# Patient Record
Sex: Female | Born: 1990
Health system: Southern US, Community
[De-identification: ages and names within clinical notes are randomized; demographics above are authoritative.]

## PROBLEM LIST (undated history)

## (undated) DIAGNOSIS — O09299 Supervision of pregnancy with other poor reproductive or obstetric history, unspecified trimester: Secondary | ICD-10-CM

## (undated) DIAGNOSIS — Z8489 Family history of other specified conditions: Secondary | ICD-10-CM

## (undated) DIAGNOSIS — D649 Anemia, unspecified: Secondary | ICD-10-CM

## (undated) DIAGNOSIS — O139 Gestational [pregnancy-induced] hypertension without significant proteinuria, unspecified trimester: Secondary | ICD-10-CM

## (undated) HISTORY — PX: NO PAST SURGERIES: SHX2092

## (undated) HISTORY — DX: Gestational (pregnancy-induced) hypertension without significant proteinuria, unspecified trimester: O13.9

## (undated) HISTORY — DX: Supervision of pregnancy with other poor reproductive or obstetric history, unspecified trimester: O09.299

---
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
## 2014-04-17 ENCOUNTER — Other Ambulatory Visit: Payer: Self-pay | Admitting: Occupational Medicine

## 2014-04-17 ENCOUNTER — Ambulatory Visit: Payer: Self-pay

## 2014-04-17 DIAGNOSIS — R7612 Nonspecific reaction to cell mediated immunity measurement of gamma interferon antigen response without active tuberculosis: Secondary | ICD-10-CM

## 2014-08-19 ENCOUNTER — Encounter (HOSPITAL_COMMUNITY): Payer: Self-pay | Admitting: Emergency Medicine

## 2014-08-19 ENCOUNTER — Emergency Department (HOSPITAL_COMMUNITY)
Admission: EM | Admit: 2014-08-19 | Discharge: 2014-08-20 | Disposition: A | Discharge status: Home / Self Care | Payer: 59 | Attending: Emergency Medicine | Admitting: Emergency Medicine

## 2014-08-19 DIAGNOSIS — R1030 Lower abdominal pain, unspecified: Secondary | ICD-10-CM | POA: Diagnosis present

## 2014-08-19 DIAGNOSIS — Z793 Long term (current) use of hormonal contraceptives: Secondary | ICD-10-CM | POA: Insufficient documentation

## 2014-08-19 DIAGNOSIS — Z3202 Encounter for pregnancy test, result negative: Secondary | ICD-10-CM | POA: Insufficient documentation

## 2014-08-19 DIAGNOSIS — R110 Nausea: Secondary | ICD-10-CM | POA: Diagnosis not present

## 2014-08-19 DIAGNOSIS — R102 Pelvic and perineal pain: Secondary | ICD-10-CM | POA: Diagnosis not present

## 2014-08-19 LAB — CBC WITH DIFFERENTIAL/PLATELET
BASOS PCT: 1 % (ref 0–1)
Basophils Absolute: 0.1 10*3/uL (ref 0.0–0.1)
EOS ABS: 0.2 10*3/uL (ref 0.0–0.7)
Eosinophils Relative: 1 % (ref 0–5)
HEMATOCRIT: 34.6 % — AB (ref 36.0–46.0)
Hemoglobin: 11.6 g/dL — ABNORMAL LOW (ref 12.0–15.0)
Lymphocytes Relative: 22 % (ref 12–46)
Lymphs Abs: 2.8 10*3/uL (ref 0.7–4.0)
MCH: 28.2 pg (ref 26.0–34.0)
MCHC: 33.5 g/dL (ref 30.0–36.0)
MCV: 84.2 fL (ref 78.0–100.0)
MONO ABS: 0.8 10*3/uL (ref 0.1–1.0)
Monocytes Relative: 7 % (ref 3–12)
NEUTROS PCT: 69 % (ref 43–77)
Neutro Abs: 8.5 10*3/uL — ABNORMAL HIGH (ref 1.7–7.7)
Platelets: 291 10*3/uL (ref 150–400)
RBC: 4.11 MIL/uL (ref 3.87–5.11)
RDW: 13 % (ref 11.5–15.5)
WBC: 12.3 10*3/uL — ABNORMAL HIGH (ref 4.0–10.5)

## 2014-08-19 LAB — I-STAT CHEM 8, ED
BUN: 13 mg/dL (ref 6–23)
CALCIUM ION: 1.17 mmol/L (ref 1.12–1.23)
CREATININE: 0.8 mg/dL (ref 0.50–1.10)
Chloride: 103 mEq/L (ref 96–112)
GLUCOSE: 132 mg/dL — AB (ref 70–99)
HEMATOCRIT: 38 % (ref 36.0–46.0)
HEMOGLOBIN: 12.9 g/dL (ref 12.0–15.0)
Potassium: 3.8 mEq/L (ref 3.7–5.3)
Sodium: 138 mEq/L (ref 137–147)
TCO2: 23 mmol/L (ref 0–100)

## 2014-08-19 LAB — URINALYSIS, ROUTINE W REFLEX MICROSCOPIC
BILIRUBIN URINE: NEGATIVE
Glucose, UA: NEGATIVE mg/dL
KETONES UR: NEGATIVE mg/dL
Leukocytes, UA: NEGATIVE
Nitrite: NEGATIVE
PH: 6 (ref 5.0–8.0)
PROTEIN: NEGATIVE mg/dL
Specific Gravity, Urine: 1.025 (ref 1.005–1.030)
Urobilinogen, UA: 0.2 mg/dL (ref 0.0–1.0)

## 2014-08-19 LAB — POC URINE PREG, ED: Preg Test, Ur: NEGATIVE

## 2014-08-19 LAB — URINE MICROSCOPIC-ADD ON

## 2014-08-19 NOTE — ED Notes (Addendum)
Pt c/o lower abd pain onset approx 20 min. Ago.  Nausea with no vomiting.   Pt st's she was having sex when pain started.

## 2014-08-20 LAB — RPR

## 2014-08-20 LAB — WET PREP, GENITAL
Clue Cells Wet Prep HPF POC: NONE SEEN
TRICH WET PREP: NONE SEEN
WBC, Wet Prep HPF POC: NONE SEEN
Yeast Wet Prep HPF POC: NONE SEEN

## 2014-08-20 LAB — HIV ANTIBODY (ROUTINE TESTING W REFLEX): HIV: NONREACTIVE

## 2014-08-20 NOTE — ED Provider Notes (Signed)
CSN: [id number]     Arrival date & time 08/19/14  2117 History   First MD Initiated Contact with Patient 08/19/14 2346     Chief Complaint  Patient presents with  . Abdominal Pain     (Consider location/radiation/quality/duration/timing/severity/associated sxs/prior Treatment) HPI  This a 23 year old female who presents with abdominal pain. Patient reports that she was having sex just prior to arrival when she had onset of crampy lower abdominal pain. She has had one similar episode in the past. She states that it becomes difficult to move or walk secondary to the pain. Her pain is somewhat subsided at this time and is 2 out of 10. When she was experiencing pain she had nausea. She reports that she has one sexual partner denies any vaginal discharge or dysuria. Denies any vomiting or diarrhea.  History reviewed. No pertinent past medical history. History reviewed. No pertinent past surgical history. No family history on file. History  Substance Use Topics  . Smoking status: Never Smoker   . Smokeless tobacco: Not on file  . Alcohol Use: Not on file     Comment: rare   OB History    No data available     Review of Systems  Constitutional: Negative for fever.  Respiratory: Negative for cough, chest tightness and shortness of breath.   Cardiovascular: Negative for chest pain.  Gastrointestinal: Positive for nausea and abdominal pain. Negative for vomiting, diarrhea, constipation and blood in stool.  Genitourinary: Negative for dysuria, vaginal bleeding and vaginal discharge.  Musculoskeletal: Negative for back pain.  Neurological: Negative for headaches.  All other systems reviewed and are negative.     Allergies  Mango flavor  Home Medications   Prior to Admission medications   Medication Sig Start Date End Date Taking? Authorizing Provider  ibuprofen (ADVIL,MOTRIN) 200 MG tablet Take 200 mg by mouth every 6 (six) hours as needed for cramping.   Yes Historical Provider,  MD  Norgestimate-Ethinyl Estradiol Triphasic (TRI-SPRINTEC) 0.18/0.215/0.25 MG-35 MCG tablet Take 1 tablet by mouth daily.   Yes Historical Provider, MD   BP 106/64 mmHg  Pulse 65  Temp(Src) 97.9 F (36.6 C)  Resp 16  SpO2 100%  LMP 08/05/2014 Physical Exam  Constitutional: She is oriented to person, place, and time. She appears well-developed and well-nourished. No distress.  HENT:  Head: Normocephalic and atraumatic.  Cardiovascular: Normal rate, regular rhythm and normal heart sounds.   No murmur heard. Pulmonary/Chest: Effort normal and breath sounds normal. No respiratory distress. She has no wheezes.  Abdominal: Soft. Bowel sounds are normal. There is no tenderness. There is no rebound and no guarding.  Genitourinary: Vagina normal and uterus normal.  No adnexal tenderness or cervical motion tenderness noted, scant vaginal discharge  Neurological: She is alert and oriented to person, place, and time.  Skin: Skin is warm and dry.  Psychiatric: She has a normal mood and affect.  Nursing note and vitals reviewed.   ED Course  Procedures (including critical care time) Labs Review Labs Reviewed  URINALYSIS, ROUTINE W REFLEX MICROSCOPIC - Abnormal; Notable for the following:    Hgb urine dipstick SMALL (*)    All other components within normal limits  CBC WITH DIFFERENTIAL - Abnormal; Notable for the following:    WBC 12.3 (*)    Hemoglobin 11.6 (*)    HCT 34.6 (*)    Neutro Abs 8.5 (*)    All other components within normal limits  URINE MICROSCOPIC-ADD ON - Abnormal; Notable for the following:  Squamous Epithelial / LPF FEW (*)    All other components within normal limits  I-STAT CHEM 8, ED - Abnormal; Notable for the following:    Glucose, Bld 132 (*)    All other components within normal limits  WET PREP, GENITAL  GC/CHLAMYDIA PROBE AMP  PREGNANCY, URINE  RPR  HIV ANTIBODY (ROUTINE TESTING)  POC URINE PREG, ED    Imaging Review No results found.   EKG  Interpretation None      MDM   Final diagnoses:  Pelvic pain in female    Patient presents with pelvic pain in the setting of sexual activity. Currently is comfortable and pain is only 2 out of 10. She has no lateralizing findings on exam and pelvic exam is largely unremarkable. Basic labwork is also unrevealing.  Patient reports that she is not concerned about STDs. Screening was sent but patient was not treated; while PID is a consideration, patient does not have any physical exam findings consistent with this. Other saturations include endometriosis.  Will have patient follow-up with OB/GYN. Patient given strict return precautions.  After history, exam, and medical workup I feel the patient has been appropriately medically screened and is safe for discharge home. Pertinent diagnoses were discussed with the patient. Patient was given return precautions.     Shon Batonourtney F Lillion Elbert, MD 08/20/14 [id number]

## 2014-08-20 NOTE — Discharge Instructions (Signed)
You were evaluated for pelvic pain. Ear exam and workup does not reveal a cause. You need to follow-up with an OB/GYN.  Abdominal Pain, Women Abdominal (stomach, pelvic, or belly) pain can be caused by many things. It is important to tell your doctor:  The location of the pain.  Does it come and go or is it present all the time?  Are there things that start the pain (eating certain foods, exercise)?  Are there other symptoms associated with the pain (fever, nausea, vomiting, diarrhea)? All of this is helpful to know when trying to find the cause of the pain. CAUSES   Stomach: virus or bacteria infection, or ulcer.  Intestine: appendicitis (inflamed appendix), regional ileitis (Crohn's disease), ulcerative colitis (inflamed colon), irritable bowel syndrome, diverticulitis (inflamed diverticulum of the colon), or cancer of the stomach or intestine.  Gallbladder disease or stones in the gallbladder.  Kidney disease, kidney stones, or infection.  Pancreas infection or cancer.  Fibromyalgia (pain disorder).  Diseases of the female organs:  Uterus: fibroid (non-cancerous) tumors or infection.  Fallopian tubes: infection or tubal pregnancy.  Ovary: cysts or tumors.  Pelvic adhesions (scar tissue).  Endometriosis (uterus lining tissue growing in the pelvis and on the pelvic organs).  Pelvic congestion syndrome (female organs filling up with blood just before the menstrual period).  Pain with the menstrual period.  Pain with ovulation (producing an egg).  Pain with an IUD (intrauterine device, birth control) in the uterus.  Cancer of the female organs.  Functional pain (pain not caused by a disease, may improve without treatment).  Psychological pain.  Depression. DIAGNOSIS  Your doctor will decide the seriousness of your pain by doing an examination.  Blood tests.  X-rays.  Ultrasound.  CT scan (computed tomography, special type of X-ray).  MRI (magnetic  resonance imaging).  Cultures, for infection.  Barium enema (dye inserted in the large intestine, to better view it with X-rays).  Colonoscopy (looking in intestine with a lighted tube).  Laparoscopy (minor surgery, looking in abdomen with a lighted tube).  Major abdominal exploratory surgery (looking in abdomen with a large incision). TREATMENT  The treatment will depend on the cause of the pain.   Many cases can be observed and treated at home.  Over-the-counter medicines recommended by your caregiver.  Prescription medicine.  Antibiotics, for infection.  Birth control pills, for painful periods or for ovulation pain.  Hormone treatment, for endometriosis.  Nerve blocking injections.  Physical therapy.  Antidepressants.  Counseling with a psychologist or psychiatrist.  Minor or major surgery. HOME CARE INSTRUCTIONS   Do not take laxatives, unless directed by your caregiver.  Take over-the-counter pain medicine only if ordered by your caregiver. Do not take aspirin because it can cause an upset stomach or bleeding.  Try a clear liquid diet (broth or water) as ordered by your caregiver. Slowly move to a bland diet, as tolerated, if the pain is related to the stomach or intestine.  Have a thermometer and take your temperature several times a day, and record it.  Bed rest and sleep, if it helps the pain.  Avoid sexual intercourse, if it causes pain.  Avoid stressful situations.  Keep your follow-up appointments and tests, as your caregiver orders.  If the pain does not go away with medicine or surgery, you may try:  Acupuncture.  Relaxation exercises (yoga, meditation).  Group therapy.  Counseling. SEEK MEDICAL CARE IF:   You notice certain foods cause stomach pain.  Your home care  treatment is not helping your pain.  You need stronger pain medicine.  You want your IUD removed.  You feel faint or lightheaded.  You develop nausea and  vomiting.  You develop a rash.  You are having side effects or an allergy to your medicine. SEEK IMMEDIATE MEDICAL CARE IF:   Your pain does not go away or gets worse.  You have a fever.  Your pain is felt only in portions of the abdomen. The right side could possibly be appendicitis. The left lower portion of the abdomen could be colitis or diverticulitis.  You are passing blood in your stools (bright red or black tarry stools, with or without vomiting).  You have blood in your urine.  You develop chills, with or without a fever.  You pass out. MAKE SURE YOU:   Understand these instructions.  Will watch your condition.  Will get help right away if you are not doing well or get worse. Document Released: 07/17/2007 Document Revised: 02/03/2014 Document Reviewed: 08/06/2009 [facility] Patient Information 2015 [place], [place]. This information is not intended to replace advice given to you by your health care provider. Make sure you discuss any questions you have with your health care provider.

## 2014-08-21 LAB — GC/CHLAMYDIA PROBE AMP
CT Probe RNA: NEGATIVE
GC Probe RNA: NEGATIVE

## 2014-10-03 NOTE — L&D Delivery Note (Signed)
SVD of VMI at 0509 on 10/03/15.  APGARs 6,8.  Delivery attended by NICU for moderate meconium stained fluid, magnesium sulfate >24 hours.  Placenta to pathology. The patient was pushing well but has a narrow pelvis.  FHT were in the 90s and the patient was counseled for vacuum assistance including risk of cephalohematoma and brain bleed.  A single push/pull effort was performed and there was a pop off.  After this application, FHT improved to 110s and the patient began pushing more effectively.  The baby delivered in ROA position.  Anterior shoulder did not easily deliver but the posterior arm did and the body delivered thereafter.  Cord was clamped, cut and taken to the warmer where NICU was waiting.  Cord gas and blood was obtained.  Placenta delivered S/I/3VC.  Fundus was firmed with pitocin and massage.  Perineum intact.  Mom and baby stable.  Will continue magnesium sulfate x 24 hours postpartum.    Mitchel HonourMegan Journee Kohen, DO

## 2015-03-19 LAB — OB RESULTS CONSOLE ABO/RH: RH Type: POSITIVE

## 2015-03-19 LAB — OB RESULTS CONSOLE RUBELLA ANTIBODY, IGM: RUBELLA: IMMUNE

## 2015-03-19 LAB — OB RESULTS CONSOLE GC/CHLAMYDIA
Chlamydia: NEGATIVE
Gonorrhea: NEGATIVE

## 2015-03-19 LAB — OB RESULTS CONSOLE RPR: RPR: NONREACTIVE

## 2015-03-19 LAB — OB RESULTS CONSOLE HIV ANTIBODY (ROUTINE TESTING): HIV: NONREACTIVE

## 2015-03-19 LAB — OB RESULTS CONSOLE ANTIBODY SCREEN: Antibody Screen: NEGATIVE

## 2015-03-19 LAB — OB RESULTS CONSOLE HEPATITIS B SURFACE ANTIGEN: HEP B S AG: NEGATIVE

## 2015-03-23 ENCOUNTER — Inpatient Hospital Stay (HOSPITAL_COMMUNITY): Admission: AD | Admit: 2015-03-23 | Payer: 59 | Source: Ambulatory Visit | Admitting: Obstetrics and Gynecology

## 2015-09-24 LAB — OB RESULTS CONSOLE GBS: STREP GROUP B AG: POSITIVE

## 2015-10-01 ENCOUNTER — Inpatient Hospital Stay (HOSPITAL_COMMUNITY): Payer: 59

## 2015-10-01 ENCOUNTER — Encounter (HOSPITAL_COMMUNITY): Payer: Self-pay | Admitting: *Deleted

## 2015-10-01 ENCOUNTER — Inpatient Hospital Stay (HOSPITAL_COMMUNITY)
Admission: AD | Admit: 2015-10-01 | Discharge: 2015-10-07 | DRG: 775 | Disposition: A | Discharge status: Home / Self Care | Payer: 59 | Source: Ambulatory Visit | Attending: Obstetrics & Gynecology | Admitting: Obstetrics & Gynecology

## 2015-10-01 DIAGNOSIS — O99824 Streptococcus B carrier state complicating childbirth: Secondary | ICD-10-CM | POA: Diagnosis present

## 2015-10-01 DIAGNOSIS — Z3A36 36 weeks gestation of pregnancy: Secondary | ICD-10-CM

## 2015-10-01 DIAGNOSIS — O133 Gestational [pregnancy-induced] hypertension without significant proteinuria, third trimester: Secondary | ICD-10-CM | POA: Diagnosis not present

## 2015-10-01 DIAGNOSIS — Z91018 Allergy to other foods: Secondary | ICD-10-CM

## 2015-10-01 DIAGNOSIS — O163 Unspecified maternal hypertension, third trimester: Secondary | ICD-10-CM

## 2015-10-01 DIAGNOSIS — O1490 Unspecified pre-eclampsia, unspecified trimester: Secondary | ICD-10-CM | POA: Diagnosis present

## 2015-10-01 DIAGNOSIS — O1493 Unspecified pre-eclampsia, third trimester: Secondary | ICD-10-CM | POA: Diagnosis present

## 2015-10-01 DIAGNOSIS — O1414 Severe pre-eclampsia complicating childbirth: Principal | ICD-10-CM | POA: Diagnosis present

## 2015-10-01 DIAGNOSIS — O1494 Unspecified pre-eclampsia, complicating childbirth: Secondary | ICD-10-CM | POA: Diagnosis present

## 2015-10-01 HISTORY — DX: Anemia, unspecified: D64.9

## 2015-10-01 LAB — URINE MICROSCOPIC-ADD ON: Bacteria, UA: NONE SEEN

## 2015-10-01 LAB — URINALYSIS, ROUTINE W REFLEX MICROSCOPIC
Bilirubin Urine: NEGATIVE
GLUCOSE, UA: 100 mg/dL — AB
Ketones, ur: NEGATIVE mg/dL
LEUKOCYTES UA: NEGATIVE
Nitrite: NEGATIVE
Protein, ur: >300 mg/dL — AB
Specific Gravity, Urine: 1.025 (ref 1.005–1.030)
pH: 6 (ref 5.0–8.0)

## 2015-10-01 LAB — COMPREHENSIVE METABOLIC PANEL
ALT: 29 U/L (ref 14–54)
ANION GAP: 7 (ref 5–15)
AST: 34 U/L (ref 15–41)
Albumin: 2.4 g/dL — ABNORMAL LOW (ref 3.5–5.0)
Alkaline Phosphatase: 142 U/L — ABNORMAL HIGH (ref 38–126)
BILIRUBIN TOTAL: 0.5 mg/dL (ref 0.3–1.2)
BUN: 10 mg/dL (ref 6–20)
CALCIUM: 8.6 mg/dL — AB (ref 8.9–10.3)
CO2: 21 mmol/L — ABNORMAL LOW (ref 22–32)
Chloride: 109 mmol/L (ref 101–111)
Creatinine, Ser: 0.82 mg/dL (ref 0.44–1.00)
GFR calc non Af Amer: >60 mL/min (ref >60)
Glucose, Bld: 106 mg/dL — ABNORMAL HIGH (ref 65–99)
Potassium: 4.1 mmol/L (ref 3.5–5.1)
Sodium: 137 mmol/L (ref 135–145)
Total Protein: 5.7 g/dL — ABNORMAL LOW (ref 6.5–8.1)

## 2015-10-01 LAB — TYPE AND SCREEN
ABO/RH(D): A POS
Antibody Screen: NEGATIVE

## 2015-10-01 LAB — CBC WITH DIFFERENTIAL/PLATELET
Basophils Absolute: 0.1 10*3/uL (ref 0.0–0.1)
Basophils Relative: 1 %
Eosinophils Absolute: 0.1 10*3/uL (ref 0.0–0.7)
Eosinophils Relative: 1 %
HEMATOCRIT: 33 % — AB (ref 36.0–46.0)
Hemoglobin: 10.9 g/dL — ABNORMAL LOW (ref 12.0–15.0)
LYMPHS PCT: 19 %
Lymphs Abs: 1.8 10*3/uL (ref 0.7–4.0)
MCH: 26.5 pg (ref 26.0–34.0)
MCHC: 33 g/dL (ref 30.0–36.0)
MCV: 80.3 fL (ref 78.0–100.0)
Monocytes Absolute: 0.4 10*3/uL (ref 0.1–1.0)
Monocytes Relative: 4 %
NEUTROS ABS: 7.4 10*3/uL (ref 1.7–7.7)
Neutrophils Relative %: 75 %
Platelets: 293 10*3/uL (ref 150–400)
RBC: 4.11 MIL/uL (ref 3.87–5.11)
RDW: 15.9 % — AB (ref 11.5–15.5)
WBC: 9.7 10*3/uL (ref 4.0–10.5)

## 2015-10-01 LAB — PROTEIN / CREATININE RATIO, URINE
Creatinine, Urine: 249 mg/dL
PROTEIN CREATININE RATIO: 11.52 mg/mg{creat} — AB (ref 0.00–0.15)
TOTAL PROTEIN, URINE: 2868 mg/dL

## 2015-10-01 LAB — URIC ACID: URIC ACID, SERUM: 5.6 mg/dL (ref 2.3–6.6)

## 2015-10-01 LAB — LACTATE DEHYDROGENASE: LDH: 166 U/L (ref 98–192)

## 2015-10-01 MED ORDER — BETAMETHASONE SOD PHOS & ACET 6 (3-3) MG/ML IJ SUSP
12.0000 mg | INTRAMUSCULAR | Status: AC
  Administered 2015-10-01 – 2015-10-02 (×2): 12 mg via INTRAMUSCULAR
  Filled 2015-10-01 (×2): qty 2

## 2015-10-01 MED ORDER — ZOLPIDEM TARTRATE 5 MG PO TABS: 5.0000 mg | ORAL_TABLET | Freq: Every evening | ORAL | Status: DC | PRN

## 2015-10-01 MED ORDER — MISOPROSTOL 25 MCG QUARTER TABLET
25.0000 ug | ORAL_TABLET | ORAL | Status: DC | PRN
  Administered 2015-10-01 – 2015-10-02 (×4): 25 ug via VAGINAL
  Filled 2015-10-01 (×4): qty 0.25
  Filled 2015-10-01: qty 1
  Filled 2015-10-01: qty 0.25

## 2015-10-01 MED ORDER — MAGNESIUM SULFATE 50 % IJ SOLN
1.0000 g/h | INTRAVENOUS | Status: DC
  Administered 2015-10-02: 2 g/h via INTRAVENOUS
  Filled 2015-10-01 (×2): qty 80

## 2015-10-01 MED ORDER — LACTATED RINGERS IV SOLN
INTRAVENOUS | Status: DC
  Administered 2015-10-01: 21:00:00 via INTRAVENOUS
  Administered 2015-10-02: 100 mL/h via INTRAVENOUS
  Administered 2015-10-03: via INTRAVENOUS

## 2015-10-01 MED ORDER — LABETALOL HCL 5 MG/ML IV SOLN
20.0000 mg | INTRAVENOUS | Status: AC | PRN
  Administered 2015-10-01: 40 mg via INTRAVENOUS
  Administered 2015-10-01: 20 mg via INTRAVENOUS
  Administered 2015-10-01: 80 mg via INTRAVENOUS
  Filled 2015-10-01: qty 4
  Filled 2015-10-01: qty 16
  Filled 2015-10-01: qty 8

## 2015-10-01 MED ORDER — HYDRALAZINE HCL 20 MG/ML IJ SOLN: 10.0000 mg | Freq: Once | INTRAMUSCULAR | Status: DC | PRN

## 2015-10-01 MED ORDER — LABETALOL HCL 100 MG PO TABS
200.0000 mg | ORAL_TABLET | Freq: Two times a day (BID) | ORAL | Status: DC
  Administered 2015-10-01 – 2015-10-02 (×3): 200 mg via ORAL
  Filled 2015-10-01 (×2): qty 2

## 2015-10-01 MED ORDER — CALCIUM CARBONATE ANTACID 500 MG PO CHEW: 2.0000 | CHEWABLE_TABLET | ORAL | Status: DC | PRN

## 2015-10-01 MED ORDER — DOCUSATE SODIUM 100 MG PO CAPS
100.0000 mg | ORAL_CAPSULE | Freq: Every day | ORAL | Status: DC
  Administered 2015-10-02: 100 mg via ORAL
  Filled 2015-10-01: qty 1

## 2015-10-01 MED ORDER — TERBUTALINE SULFATE 1 MG/ML IJ SOLN: 0.2500 mg | Freq: Once | INTRAMUSCULAR | Status: DC | PRN

## 2015-10-01 MED ORDER — PENICILLIN G POTASSIUM 5000000 UNITS IJ SOLR
2.5000 10*6.[IU] | INTRAVENOUS | Status: DC
  Administered 2015-10-02 – 2015-10-03 (×5): 2.5 10*6.[IU] via INTRAVENOUS
  Filled 2015-10-01 (×8): qty 2.5

## 2015-10-01 MED ORDER — MAGNESIUM SULFATE BOLUS VIA INFUSION
4.0000 g | Freq: Once | INTRAVENOUS | Status: AC
  Administered 2015-10-01: 4 g via INTRAVENOUS
  Filled 2015-10-01: qty 500

## 2015-10-01 MED ORDER — ACETAMINOPHEN 325 MG PO TABS: 650.0000 mg | ORAL_TABLET | ORAL | Status: DC | PRN

## 2015-10-01 MED ORDER — PENICILLIN G POTASSIUM 5000000 UNITS IJ SOLR
5.0000 10*6.[IU] | Freq: Once | INTRAVENOUS | Status: AC
  Administered 2015-10-02: 5 10*6.[IU] via INTRAVENOUS
  Filled 2015-10-01: qty 5

## 2015-10-01 MED ORDER — PRENATAL MULTIVITAMIN CH
1.0000 | ORAL_TABLET | Freq: Every day | ORAL | Status: DC
  Administered 2015-10-02: 1 via ORAL
  Filled 2015-10-01: qty 1

## 2015-10-01 NOTE — MAU Note (Signed)
OBs office sent pt for PIH eval;

## 2015-10-01 NOTE — H&P (Signed)
Rachel Blevins is a 24 y.o. female presenting at 4036.2 with elevated blood pressure and protienuria in the office.  In MAU  Initial bp 149/98. But later it was 178/109 and 181/114.  Protein in the urine 2868 gm.  pih labs otherwise were normal. admitt for steriod and then induction. Maternal Medical History:  Fetal activity: Perceived fetal activity is normal.    Prenatal complications: Pre-eclampsia.   Prenatal Complications - Diabetes: none.    OB History    Gravida Para Term Preterm AB TAB SAB Ectopic Multiple Living   1              Past Medical History  Diagnosis Date  . Anemia    Past Surgical History  Procedure Laterality Date  . No past surgeries     Family History: family history includes Hypertension in her father, maternal grandmother, and mother. Social History:  reports that she has never smoked. She does not have any smokeless tobacco history on file. She reports that she does not drink alcohol or use illicit drugs.   Prenatal Transfer Tool  Maternal Diabetes: No Genetic Screening: Normal Maternal Ultrasounds/Referrals: Normal Fetal Ultrasounds or other Referrals:  None Maternal Substance Abuse:  No Significant Maternal Medications:  None Significant Maternal Lab Results:  Lab values include: Group B Strep positive Other Comments:  None  ROS    Blood pressure 178/109, pulse 75. Maternal Exam:  Uterine Assessment: Contraction strength is mild.  Contraction frequency is irregular.   Abdomen: Patient reports no abdominal tenderness. Fundal height is c/w dates.   Fetal presentation: vertex  Pelvis: adequate for delivery.      Fetal Exam Fetal State Assessment: Category I - tracings are normal.     Physical Exam  Constitutional: She is oriented to person, place, and time.  Cardiovascular: Normal rate, regular rhythm and normal heart sounds.   Respiratory: Effort normal and breath sounds normal.  GI:  Gravid uterus c/w dates  Musculoskeletal: She  exhibits edema.  Neurological: She is alert and oriented to person, place, and time. She has normal reflexes.    Prenatal labs: ABO, Rh:   Antibody:   Rubella:   RPR:    HBsAg:    HIV:    GBS:     Assessment/Plan: IUP at 36.2 with pre-eclampsia BPP pending. Will admitt  Give steriods per recent acog guidelines and the induce.   Nina Hoar S 10/01/2015, 6:19 PM

## 2015-10-01 NOTE — MAU Provider Note (Signed)
History     CSN: [id number]  Arrival date and time: 10/01/15 1341   First Provider Initiated Contact with Patient 10/01/15 1502      Chief Complaint  Patient presents with  . Hypertension   HPI Rachel Blevins 24 y.o. G1P0  presents to MAU for PIH evaluation.  Earlier today, she was seen in office and found to have elevated bp and protein in urine.  No other complaints today.  She states a week ago she noticed a dramatic increase in swelling in her face, feet and hands.  She also started having headaches at that time.  They are mild and she does not take anything for them.  She also has recently started seeing spots - particularly with showering.  She gets very tired with walking.  She denies epigastric pain, LOF, vaginal bleeding, ctx, dysuria.  She notes good fetal movement.  OB History    Gravida Para Term Preterm AB TAB SAB Ectopic Multiple Living   1               Past Medical History  Diagnosis Date  . Anemia     Past Surgical History  Procedure Laterality Date  . No past surgeries      Family History  Problem Relation Age of Onset  . Hypertension Mother   . Hypertension Father   . Hypertension Maternal Grandmother     Social History  Substance Use Topics  . Smoking status: Never Smoker   . Smokeless tobacco: None  . Alcohol Use: No     Comment: rare    Allergies:  Allergies  Allergen Reactions  . Mango Flavor [Flavoring Agent] Anaphylaxis, Itching and Swelling    Prescriptions prior to admission  Medication Sig Dispense Refill Last Dose  . calcium carbonate (TUMS - DOSED IN MG ELEMENTAL CALCIUM) 500 MG chewable tablet Chew 1 tablet by mouth as needed for indigestion or heartburn.   09/30/2015 at Unknown time  . Prenatal Vit-Fe Fumarate-FA (PRENATAL MULTIVITAMIN) TABS tablet Take 1 tablet by mouth daily at 12 noon.   09/30/2015 at Unknown time    ROS Pertinent ROS in HPI.  All other systems are negative.   Physical Exam   There were no vitals  taken for this visit.  Physical Exam  Constitutional: She is oriented to person, place, and time. She appears well-developed and well-nourished.  HENT:  Head: Normocephalic and atraumatic.  Eyes: Conjunctivae and EOM are normal.  Neck: Normal range of motion. Neck supple.  Cardiovascular: Normal rate and normal heart sounds.   Respiratory: Effort normal and breath sounds normal. No respiratory distress.  GI: Soft. Bowel sounds are normal. She exhibits no distension. There is no tenderness.  Musculoskeletal: Normal range of motion. She exhibits no edema.  Neurological: She is alert and oriented to person, place, and time.  Skin: Skin is warm and dry.  Psychiatric: She has a normal mood and affect. Her behavior is normal.   Fetal Tracing: Baseline:135 Variability:mod Accelerations: 15x15s Decelerations:none Toco:none   MAU Course  Procedures  MDM Dr. Arelia Sneddon called and indicates he does want BPP/AFI.  He has also asked for blood pressures - not initially in chart.  Blood pressures now available from nurses and were initially in severe range but have improved since that time.  U/S ordered.  Upon return from u/s, pt again with severe BP.   Dr. Arelia Sneddon called and informed of pr/cr ratio as well as severe BP.  He will come down to see pt.  MD in unit to see patient.  He assumes care and is admitting patient.   Assessment and Plan  A: PIH  P: Admit  Bertram DenverKaren E Teague Clark 10/01/2015, 3:03 PM

## 2015-10-01 NOTE — Progress Notes (Signed)
Patient ID: Rachel PlantsCecilia Blevins, female   DOB: [birth year], 24 y.o.   MRN: [id number] Blood pressures in the severe range despite labetalol IV. fhr cat one BPP 8/8 Precede with magnesium sulfate and cytotec induction Risk of prematurity discussed.

## 2015-10-02 LAB — MAGNESIUM: MAGNESIUM: 7.3 mg/dL — AB (ref 1.7–2.4)

## 2015-10-02 LAB — ABO/RH: ABO/RH(D): A POS

## 2015-10-02 LAB — RPR: RPR Ser Ql: NONREACTIVE

## 2015-10-02 MED ORDER — OXYTOCIN 40 UNITS IN LACTATED RINGERS INFUSION - SIMPLE MED
1.0000 m[IU]/min | INTRAVENOUS | Status: DC
  Administered 2015-10-02: 2 m[IU]/min via INTRAVENOUS
  Administered 2015-10-03: 666 m[IU]/min via INTRAVENOUS
  Filled 2015-10-02: qty 1000

## 2015-10-02 MED ORDER — HYDRALAZINE HCL 20 MG/ML IJ SOLN
10.0000 mg | Freq: Once | INTRAMUSCULAR | Status: AC
  Administered 2015-10-02: 10 mg via INTRAVENOUS
  Filled 2015-10-02: qty 1

## 2015-10-02 MED ORDER — BUTORPHANOL TARTRATE 1 MG/ML IJ SOLN
1.0000 mg | INTRAMUSCULAR | Status: DC | PRN
  Administered 2015-10-02 – 2015-10-03 (×4): 1 mg via INTRAVENOUS
  Filled 2015-10-02 (×3): qty 1

## 2015-10-02 MED ORDER — TERBUTALINE SULFATE 1 MG/ML IJ SOLN: 0.2500 mg | Freq: Once | INTRAMUSCULAR | Status: DC | PRN

## 2015-10-02 MED ORDER — BUTORPHANOL TARTRATE 1 MG/ML IJ SOLN
INTRAMUSCULAR | Status: AC
  Filled 2015-10-02: qty 1

## 2015-10-02 MED ORDER — ONDANSETRON HCL 4 MG/2ML IJ SOLN
4.0000 mg | Freq: Four times a day (QID) | INTRAMUSCULAR | Status: DC | PRN
  Administered 2015-10-02 – 2015-10-03 (×3): 4 mg via INTRAVENOUS
  Filled 2015-10-02 (×3): qty 2

## 2015-10-02 MED ORDER — ZOLPIDEM TARTRATE 5 MG PO TABS: 5.0000 mg | ORAL_TABLET | Freq: Every evening | ORAL | Status: DC | PRN

## 2015-10-02 NOTE — Progress Notes (Signed)
No c/o.  Feels minimal CTX after last dose of VMP at 2pm. SVE 1/75/-2, Cook catheter placed Pitocin will be started.  Mitchel HonourMegan Meiko Stranahan, DO

## 2015-10-02 NOTE — Progress Notes (Signed)
Esmond PlantsCecilia Deluna is a 24 y.o. G1P0 at 5737w3d by ultrasound admitted for induction of labor due to Pre-eclamptic toxemia of pregnancy..  Subjective: No current c/o.  No HA, CP/SOB, RUQ pain, or vision change.  Mild CTX with last VMP at 0230.  Objective: BP 114/71 mmHg  Pulse 79  Temp(Src) 98.6 F (37 C) (Oral)  Resp 16  Ht 5\' 3"  (1.6 m)  Wt 108.863 kg (240 lb)  BMI 42.52 kg/m2 I/O last 3 completed shifts: In: 1503.33 [P.O.:480; I.V.:1023.33] Out: 1060 [Urine:1060] Total I/O In: 100 [I.V.:100] Out: 0   FHT:  FHR: 125 bpm, variability: moderate,  accelerations:  Present,  decelerations:  Absent UC:   irregular, every 5-7 minutes SVE:   Dilation: Fingertip Effacement (%): 50 Station: -3 Exam by:: Doloris HallJenny Middleton RN  Labs: Lab Results  Component Value Date   WBC 9.7 10/01/2015   HGB 10.9* 10/01/2015   HCT 33.0* 10/01/2015   MCV 80.3 10/01/2015   PLT 293 10/01/2015    Assessment / Plan: Induction of labor due to preeclampsia,  progressing well on pitocin  Labor: Progressing normally.  CTX have now spaced; will continue VMP Preeclampsia:  on magnesium sulfate, no signs or symptoms of toxicity, intake and ouput balanced and labs stable Fetal Wellbeing:  Category I Pain Control:  Labor support without medications I/D:  n/a Anticipated MOD:  NSVD  Ethlyn Alto 10/02/2015, 8:15 AM

## 2015-10-03 ENCOUNTER — Encounter (HOSPITAL_COMMUNITY): Payer: Self-pay | Admitting: *Deleted

## 2015-10-03 ENCOUNTER — Inpatient Hospital Stay (HOSPITAL_COMMUNITY): Payer: 59 | Admitting: Anesthesiology

## 2015-10-03 DIAGNOSIS — O1490 Unspecified pre-eclampsia, unspecified trimester: Secondary | ICD-10-CM | POA: Diagnosis present

## 2015-10-03 LAB — COMPREHENSIVE METABOLIC PANEL
ALBUMIN: 2.2 g/dL — AB (ref 3.5–5.0)
ALK PHOS: 136 U/L — AB (ref 38–126)
ALT: 34 U/L (ref 14–54)
ALT: 30 U/L (ref 14–54)
ANION GAP: 8 (ref 5–15)
AST: 52 U/L — ABNORMAL HIGH (ref 15–41)
AST: 33 U/L (ref 15–41)
Albumin: 2.5 g/dL — ABNORMAL LOW (ref 3.5–5.0)
Alkaline Phosphatase: 115 U/L (ref 38–126)
Anion gap: 11 (ref 5–15)
BUN: 15 mg/dL (ref 6–20)
BUN: 16 mg/dL (ref 6–20)
CALCIUM: 7.2 mg/dL — AB (ref 8.9–10.3)
CHLORIDE: 104 mmol/L (ref 101–111)
CO2: 21 mmol/L — ABNORMAL LOW (ref 22–32)
CO2: 23 mmol/L (ref 22–32)
CREATININE: 1.09 mg/dL — AB (ref 0.44–1.00)
Calcium: 7.3 mg/dL — ABNORMAL LOW (ref 8.9–10.3)
Chloride: 98 mmol/L — ABNORMAL LOW (ref 101–111)
Creatinine, Ser: 1.11 mg/dL — ABNORMAL HIGH (ref 0.44–1.00)
GFR calc Af Amer: >60 mL/min (ref >60)
GFR calc non Af Amer: >60 mL/min (ref >60)
GLUCOSE: 103 mg/dL — AB (ref 65–99)
Glucose, Bld: 123 mg/dL — ABNORMAL HIGH (ref 65–99)
POTASSIUM: 4.8 mmol/L (ref 3.5–5.1)
Potassium: 4 mmol/L (ref 3.5–5.1)
SODIUM: 135 mmol/L (ref 135–145)
Sodium: 130 mmol/L — ABNORMAL LOW (ref 135–145)
Total Bilirubin: 0.4 mg/dL (ref 0.3–1.2)
Total Bilirubin: 0.2 mg/dL — ABNORMAL LOW (ref 0.3–1.2)
Total Protein: 6.1 g/dL — ABNORMAL LOW (ref 6.5–8.1)
Total Protein: 5.6 g/dL — ABNORMAL LOW (ref 6.5–8.1)

## 2015-10-03 LAB — CBC
HCT: 33.8 % — ABNORMAL LOW (ref 36.0–46.0)
HEMATOCRIT: 34.9 % — AB (ref 36.0–46.0)
HEMOGLOBIN: 11.7 g/dL — AB (ref 12.0–15.0)
HEMOGLOBIN: 11.2 g/dL — AB (ref 12.0–15.0)
MCH: 27 pg (ref 26.0–34.0)
MCH: 26.7 pg (ref 26.0–34.0)
MCHC: 33.5 g/dL (ref 30.0–36.0)
MCHC: 33.1 g/dL (ref 30.0–36.0)
MCV: 80.6 fL (ref 78.0–100.0)
MCV: 80.7 fL (ref 78.0–100.0)
PLATELETS: 313 10*3/uL (ref 150–400)
Platelets: 340 10*3/uL (ref 150–400)
RBC: 4.33 MIL/uL (ref 3.87–5.11)
RBC: 4.19 MIL/uL (ref 3.87–5.11)
RDW: 16.6 % — AB (ref 11.5–15.5)
RDW: 16.5 % — ABNORMAL HIGH (ref 11.5–15.5)
WBC: 17.7 10*3/uL — ABNORMAL HIGH (ref 4.0–10.5)
WBC: 22.9 10*3/uL — ABNORMAL HIGH (ref 4.0–10.5)

## 2015-10-03 MED ORDER — LIDOCAINE HCL (PF) 1 % IJ SOLN
INTRAMUSCULAR | Status: DC | PRN
  Administered 2015-10-03 (×2): 4 mL via EPIDURAL

## 2015-10-03 MED ORDER — DIPHENHYDRAMINE HCL 50 MG/ML IJ SOLN: 12.5000 mg | INTRAMUSCULAR | Status: DC | PRN

## 2015-10-03 MED ORDER — OXYCODONE-ACETAMINOPHEN 5-325 MG PO TABS: 2.0000 | ORAL_TABLET | ORAL | Status: DC | PRN

## 2015-10-03 MED ORDER — MAGNESIUM SULFATE 50 % IJ SOLN
1.0000 g/h | INTRAVENOUS | Status: AC
  Administered 2015-10-04: 1 g/h via INTRAVENOUS
  Filled 2015-10-03 (×2): qty 80

## 2015-10-03 MED ORDER — LIDOCAINE HCL (PF) 1 % IJ SOLN
INTRAMUSCULAR | Status: AC
  Filled 2015-10-03: qty 30

## 2015-10-03 MED ORDER — ONDANSETRON HCL 4 MG/2ML IJ SOLN: 4.0000 mg | INTRAMUSCULAR | Status: DC | PRN

## 2015-10-03 MED ORDER — WITCH HAZEL-GLYCERIN EX PADS
1.0000 | MEDICATED_PAD | CUTANEOUS | Status: DC | PRN
Start: 2015-10-03 — End: 2015-10-07

## 2015-10-03 MED ORDER — ACETAMINOPHEN 325 MG PO TABS: 650.0000 mg | ORAL_TABLET | ORAL | Status: DC | PRN

## 2015-10-03 MED ORDER — BENZOCAINE-MENTHOL 20-0.5 % EX AERO
1.0000 "application " | INHALATION_SPRAY | CUTANEOUS | Status: DC | PRN
  Administered 2015-10-04: 1 via TOPICAL
  Filled 2015-10-03: qty 56

## 2015-10-03 MED ORDER — LANOLIN HYDROUS EX OINT: TOPICAL_OINTMENT | CUTANEOUS | Status: DC | PRN

## 2015-10-03 MED ORDER — IBUPROFEN 600 MG PO TABS
600.0000 mg | ORAL_TABLET | Freq: Four times a day (QID) | ORAL | Status: DC
  Administered 2015-10-03 – 2015-10-07 (×17): 600 mg via ORAL
  Filled 2015-10-03 (×18): qty 1

## 2015-10-03 MED ORDER — SIMETHICONE 80 MG PO CHEW: 80.0000 mg | CHEWABLE_TABLET | ORAL | Status: DC | PRN

## 2015-10-03 MED ORDER — FENTANYL 2.5 MCG/ML BUPIVACAINE 1/10 % EPIDURAL INFUSION (WH - ANES)
14.0000 mL/h | INTRAMUSCULAR | Status: DC | PRN
  Administered 2015-10-03: 14 mL/h via EPIDURAL
  Filled 2015-10-03: qty 125

## 2015-10-03 MED ORDER — DIBUCAINE 1 % RE OINT
1.0000 "application " | TOPICAL_OINTMENT | RECTAL | Status: DC | PRN
  Administered 2015-10-04: 1 via RECTAL
  Filled 2015-10-03: qty 28

## 2015-10-03 MED ORDER — OXYCODONE-ACETAMINOPHEN 5-325 MG PO TABS: 1.0000 | ORAL_TABLET | ORAL | Status: DC | PRN

## 2015-10-03 MED ORDER — LABETALOL HCL 100 MG PO TABS
200.0000 mg | ORAL_TABLET | Freq: Three times a day (TID) | ORAL | Status: DC
  Administered 2015-10-03 – 2015-10-04 (×6): 200 mg via ORAL
  Filled 2015-10-03 (×6): qty 2

## 2015-10-03 MED ORDER — TETANUS-DIPHTH-ACELL PERTUSSIS 5-2.5-18.5 LF-MCG/0.5 IM SUSP
0.5000 mL | Freq: Once | INTRAMUSCULAR | Status: DC
  Filled 2015-10-03: qty 0.5

## 2015-10-03 MED ORDER — PRENATAL MULTIVITAMIN CH
1.0000 | ORAL_TABLET | Freq: Every day | ORAL | Status: DC
  Administered 2015-10-03 – 2015-10-06 (×4): 1 via ORAL
  Filled 2015-10-03 (×4): qty 1

## 2015-10-03 MED ORDER — EPHEDRINE 5 MG/ML INJ
10.0000 mg | INTRAVENOUS | Status: DC | PRN
  Filled 2015-10-03: qty 2

## 2015-10-03 MED ORDER — PHENYLEPHRINE 40 MCG/ML (10ML) SYRINGE FOR IV PUSH (FOR BLOOD PRESSURE SUPPORT)
80.0000 ug | PREFILLED_SYRINGE | INTRAVENOUS | Status: DC | PRN
  Filled 2015-10-03: qty 20
  Filled 2015-10-03: qty 2

## 2015-10-03 MED ORDER — LACTATED RINGERS IV SOLN
INTRAVENOUS | Status: DC
  Administered 2015-10-03: 11:00:00 via INTRAVENOUS

## 2015-10-03 MED ORDER — ONDANSETRON HCL 4 MG PO TABS: 4.0000 mg | ORAL_TABLET | ORAL | Status: DC | PRN

## 2015-10-03 MED ORDER — ZOLPIDEM TARTRATE 5 MG PO TABS: 5.0000 mg | ORAL_TABLET | Freq: Every evening | ORAL | Status: DC | PRN

## 2015-10-03 MED ORDER — DIPHENHYDRAMINE HCL 25 MG PO CAPS: 25.0000 mg | ORAL_CAPSULE | Freq: Four times a day (QID) | ORAL | Status: DC | PRN

## 2015-10-03 MED ORDER — SENNOSIDES-DOCUSATE SODIUM 8.6-50 MG PO TABS
2.0000 | ORAL_TABLET | ORAL | Status: DC
  Administered 2015-10-04 – 2015-10-06 (×4): 2 via ORAL
  Filled 2015-10-03 (×4): qty 2

## 2015-10-03 NOTE — Anesthesia Preprocedure Evaluation (Signed)
Anesthesia Evaluation  Patient identified by MRN, date of birth, ID band Patient awake    Airway Mallampati: III       Dental  (+) Teeth Intact   Pulmonary neg pulmonary ROS,    breath sounds clear to auscultation       Cardiovascular hypertension,  Rhythm:Regular Rate:Normal     Neuro/Psych negative neurological ROS  negative psych ROS   GI/Hepatic negative GI ROS, Neg liver ROS,   Endo/Other  negative endocrine ROS  Renal/GU negative Renal ROS  negative genitourinary   Musculoskeletal negative musculoskeletal ROS (+)   Abdominal   Peds negative pediatric ROS (+)  Hematology negative hematology ROS (+) anemia ,   Anesthesia Other Findings   Reproductive/Obstetrics (+) Pregnancy                             Lab Results  Component Value Date   WBC 17.7* 10/03/2015   HGB 11.7* 10/03/2015   HCT 34.9* 10/03/2015   MCV 80.6 10/03/2015   PLT 340 10/03/2015   No results found for: INR, PROTIME   Anesthesia Physical Anesthesia Plan  ASA: III  Anesthesia Plan: Epidural   Post-op Pain Management:    Induction:   Airway Management Planned:   Additional Equipment:   Intra-op Plan:   Post-operative Plan:   Informed Consent: I have reviewed the patients History and Physical, chart, labs and discussed the procedure including the risks, benefits and alternatives for the proposed anesthesia with the patient or authorized representative who has indicated his/her understanding and acceptance.     Plan Discussed with:   Anesthesia Plan Comments:         Anesthesia Quick Evaluation

## 2015-10-03 NOTE — Anesthesia Procedure Notes (Signed)
Epidural Patient location during procedure: OB Start time: 10/03/2015 3:55 AM End time: 10/03/2015 4:09 AM  Staffing Anesthesiologist: Shona SimpsonHOLLIS, Veyda Kaufman D Performed by: anesthesiologist   Preanesthetic Checklist Completed: patient identified, site marked, surgical consent, pre-op evaluation, timeout performed, IV checked, risks and benefits discussed and monitors and equipment checked  Epidural Patient position: sitting Prep: ChloraPrep Patient monitoring: heart rate, continuous pulse ox and blood pressure Approach: midline Location: L3-L4 Injection technique: LOR saline  Needle:  Needle type: Tuohy  Needle gauge: 17 G Needle length: 9 cm Catheter type: closed end flexible Catheter size: 20 Guage Test dose: negative and 1.5% lidocaine  Assessment Events: blood not aspirated, injection not painful, no injection resistance and no paresthesia  Additional Notes LOR @ *8  Patient identified. Risks/Benefits/Options discussed with patient including but not limited to bleeding, infection, nerve damage, paralysis, failed block, incomplete pain control, headache, blood pressure changes, nausea, vomiting, reactions to medications, itching and postpartum back pain. Confirmed with bedside nurse the patient's most recent platelet count. Confirmed with patient that they are not currently taking any anticoagulation, have any bleeding history or any family history of bleeding disorders. Patient expressed understanding and wished to proceed. All questions were answered. Sterile technique was used throughout the entire procedure. Please see nursing notes for vital signs. Test dose was given through epidural catheter and negative prior to continuing to dose epidural or start infusion. Warning signs of high block given to the patient including shortness of breath, tingling/numbness in hands, complete motor block, or any concerning symptoms with instructions to call for help. Patient was given instructions  on fall risk and not to get out of bed. All questions and concerns addressed with instructions to call with any issues or inadequate analgesia.    Reason for block:procedure for pain

## 2015-10-03 NOTE — Lactation Note (Signed)
This note was copied from the chart of Boy Esmond PlantsCecilia Deluna. Lactation Consultation Note  Patient Name: Boy Esmond PlantsCecilia Deluna [id number]'s Date: 10/03/2015 Reason for consult: Initial assessment  1st baby for this mom and dad, 9911 hours old, Late preterm, 5036 4/7 day old , < 6 pounds. Mom is on Mag SO4 , with excessive edema.  Reviewed hand expressing - several drops noted, also noted areola edema and semi compressible areolas  With massage, hand express, and reverse pressure areola more compressible.  @ consult attempted latch, baby able to open mouth wide enough to get over the nipple areola junction , but not  Into sucking, even with stimulation. Baby back skin to skin until syringe was ready for finger feeding - baby took 2 ml ' [facility]BM and [facility] how to finger feed with 3F feeding tube and baby took 7 ml of formula pace - feeding. Baby tolerated well without sipping.  LC also instructed mom on the use shells when not skin to skin and when bra is available.  LC reviewed the importance of hand expressing at feeding time and in between - save EBM if baby not awake or not feeding time. DEBP already set up and mom has started pumping using the #24 [place] and per mom comfortable.  EBM yield with pumping and hand expressing. Per mom has been leaking before delivery. Late - preterm guidelines had already been given to parents - LC reviewed and stressed the baby feeding goals, and milk supply goals  Mom and dad are receptive to teaching , also attended breast feeding basics prenatal class. Mother informed of post-discharge support and given phone number to the lactation department, including services for phone call assistance; out-patient appointments; and breastfeeding support group. List of other breastfeeding resources in the community given in the handout. Encouraged mother to call for problems or concerns related to breastfeeding.   Maternal Data Has patient been taught Hand  Expression?: Yes Does the patient have breastfeeding experience prior to this delivery?: No  Feeding Feeding Type: Formula  LATCH Score/Interventions Latch: Repeated attempts needed to sustain latch, nipple held in mouth throughout feeding, stimulation needed to elicit sucking reflex. Intervention(s): Skin to skin;Teach feeding cues;Waking techniques Intervention(s): Adjust position;Assist with latch;Breast massage  Audible Swallowing: None Intervention(s): Hand expression  Type of Nipple: Flat Intervention(s): Reverse pressure  Comfort (Breast/Nipple): Soft / non-tender     Hold (Positioning): Full assist, staff holds infant at breast Intervention(s): Breastfeeding basics reviewed  LATCH Score: 4  Lactation Tools Discussed/Used Tools: Pump Breast pump type: Double-Electric Breast Pump WIC Program: No   Consult Status Consult Status: Follow-up Date: 10/04/15 Follow-up type: In-patient    Kathrin Greathouseorio, Danene Montijo Ann 10/03/2015, 4:25 PM

## 2015-10-03 NOTE — Progress Notes (Signed)
Post Partum Day 0 Subjective: no complaints, up ad lib, voiding and tolerating PO.  No HA, CP/SOB, RUQ pain, or visual disturbance.  Objective: Blood pressure 164/101, pulse 71, temperature 97.6 F (36.4 C), temperature source Oral, resp. rate 18, height 5\' 3"  (1.6 m), weight 108.863 kg (240 lb), SpO2 99 %, unknown if currently breastfeeding.  Physical Exam:  General: alert, cooperative and appears stated age Lochia: appropriate Uterine Fundus: firm Incision: n/a DVT Evaluation: No evidence of DVT seen on physical exam. Negative Homan's sign. No cords or calf tenderness. Calf/Ankle edema is present. 1+DTRs   Recent Labs  10/03/15 0310 10/03/15 0733  HGB 11.7* 11.2*  HCT 34.9* 33.8*    Assessment/Plan: Plan for discharge tomorrow  Preeclampsia with severe features-on magnesium sulfate recovery at 1g/hr secondary to severe nausea at 2g and Cr 1.09.  BPs are mostly high mild range - low severe range.  Will start po labetalol 200 tid.      LOS: 2 days   Asta Corbridge 10/03/2015, 10:57 AM

## 2015-10-04 DIAGNOSIS — O1414 Severe pre-eclampsia complicating childbirth: Secondary | ICD-10-CM | POA: Diagnosis not present

## 2015-10-04 DIAGNOSIS — O99824 Streptococcus B carrier state complicating childbirth: Secondary | ICD-10-CM | POA: Diagnosis not present

## 2015-10-04 DIAGNOSIS — Z3A36 36 weeks gestation of pregnancy: Secondary | ICD-10-CM | POA: Diagnosis not present

## 2015-10-04 DIAGNOSIS — Z91018 Allergy to other foods: Secondary | ICD-10-CM | POA: Diagnosis not present

## 2015-10-04 LAB — COMPREHENSIVE METABOLIC PANEL
ALBUMIN: 2.1 g/dL — AB (ref 3.5–5.0)
ALK PHOS: 109 U/L (ref 38–126)
ALT: 28 U/L (ref 14–54)
ANION GAP: 7 (ref 5–15)
AST: 30 U/L (ref 15–41)
BILIRUBIN TOTAL: 0.2 mg/dL — AB (ref 0.3–1.2)
BUN: 17 mg/dL (ref 6–20)
CALCIUM: 7.1 mg/dL — AB (ref 8.9–10.3)
CO2: 23 mmol/L (ref 22–32)
Chloride: 107 mmol/L (ref 101–111)
Creatinine, Ser: 1.05 mg/dL — ABNORMAL HIGH (ref 0.44–1.00)
GFR calc non Af Amer: >60 mL/min (ref >60)
GLUCOSE: 89 mg/dL (ref 65–99)
POTASSIUM: 4.5 mmol/L (ref 3.5–5.1)
SODIUM: 137 mmol/L (ref 135–145)
TOTAL PROTEIN: 4.9 g/dL — AB (ref 6.5–8.1)

## 2015-10-04 LAB — CBC
HEMATOCRIT: 30.5 % — AB (ref 36.0–46.0)
HEMOGLOBIN: 9.8 g/dL — AB (ref 12.0–15.0)
MCH: 26.2 pg (ref 26.0–34.0)
MCHC: 32.1 g/dL (ref 30.0–36.0)
MCV: 81.6 fL (ref 78.0–100.0)
Platelets: 294 10*3/uL (ref 150–400)
RBC: 3.74 MIL/uL — ABNORMAL LOW (ref 3.87–5.11)
RDW: 17.2 % — AB (ref 11.5–15.5)
WBC: 16.1 10*3/uL — ABNORMAL HIGH (ref 4.0–10.5)

## 2015-10-04 LAB — KLEIHAUER-BETKE STAIN
# VIALS RHIG: 1
Fetal Cells %: 0 %
QUANTITATION FETAL HEMOGLOBIN: 0 mL

## 2015-10-04 MED ORDER — SODIUM CHLORIDE 0.9 % IJ SOLN: 3.0000 mL | INTRAMUSCULAR | Status: DC | PRN

## 2015-10-04 NOTE — Progress Notes (Signed)
Post Partum Day 1 Subjective: no complaints, up ad lib, voiding and tolerating PO.  No HA, CP/SOB, RUQ pain, or visual disturbance.  Baby transferred to NICU secondary to fever.  Objective: Blood pressure 140/78, pulse 71, temperature 98.4 F (36.9 C), temperature source Oral, resp. rate 18, height 5\' 3"  (1.6 m), weight 107.502 kg (237 lb), SpO2 99 %, unknown if currently breastfeeding.  Physical Exam:  General: alert, cooperative and appears stated age Lochia: appropriate Uterine Fundus: firm Incision: n/a DVT Evaluation: No evidence of DVT seen on physical exam. Negative Homan's sign. No cords or calf tenderness.   Recent Labs  10/03/15 0733 10/04/15 0515  HGB 11.2* 9.8*  HCT 33.8* 30.5*  Cr 1.11->1.05  Assessment/Plan: Plan for discharge tomorrow  Pre-E-Labetalol 200 tid maintaining BPs in nl-mild range Rpt CBC, CMET in AM K-B ordered per NICU request (fetal anemia)   LOS: 3 days   Rachel Blevins 10/04/2015, 11:37 AM

## 2015-10-04 NOTE — Lactation Note (Addendum)
This note was copied from the chart of Rachel Blevins. Lactation Consultation Note  Patient Name: Rachel Blevins AVWUJ'WToday's Date: 10/04/2015 Reason for consult: Follow-up assessment;NICU baby  NICU baby 3630 hours old. Mom reports that she is "not getting very much" when she pumps--2 ml at one point. Discussed normal progression of milk coming to volume. Enc mom to pump 8 times/24 hours followed by hand expression. Enc mom to use pictures of baby on her phone while pumping. 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 Mom aware of pumping rooms in the NICU.  Maternal Data    Feeding    LATCH Score/Interventions                      Lactation Tools Discussed/Used     Consult Status Consult Status: Follow-up Date: 10/05/15 Follow-up type: In-patient    Geralynn OchsWILLIARD, Marck Mcclenny 10/04/2015, 12:04 PM

## 2015-10-04 NOTE — Lactation Note (Signed)
This note was copied from the chart of Rachel Blevins. Lactation Consultation Note  Patient Name: Rachel Blevins ZOXWR'UToday's Date: 10/04/2015 Reason for consult: Follow-up assessment;NICU baby  NICU baby 6634 hours old. Baby still cueing after bottle of EBM, so called to assist with first attempt to latch at breast. Assisted with latching baby to left breast if football position. Mom has easily expressible breast, but are not easy to compress. Mom states that her breasts have been "harder" around the nipple for a while, and she her breasts are starting to feel "heavier." Allowed baby to lick and attempt to latch directly to breast, but baby not able to latch. Fitted mom with a #20 NS, and explained that her nipple really needs a #24, but she will need to use the #20 a few times to allow the baby to move up to the #24 after becoming acquainted with the #20. Baby latched deeply, suckling rhythmically with lips flanged. No swallows noted, and there was not EBM in the NS noted. Enc mom that this is a great first attempt, and that the baby tolerated latching very well. Enc mom to offer lots of STS and attempts at the breast as baby able.  Maternal Data    Feeding Feeding Type: Breast Fed Nipple Type: Slow - flow Length of feed: 5 min  LATCH Score/Interventions Latch: Repeated attempts needed to sustain latch, nipple held in mouth throughout feeding, stimulation needed to elicit sucking reflex. Intervention(s): Skin to skin;Teach feeding cues;Waking techniques Intervention(s): Adjust position;Assist with latch;Breast compression  Audible Swallowing: None Intervention(s): Skin to skin;Hand expression  Type of Nipple: Flat  Comfort (Breast/Nipple): Soft / non-tender     Hold (Positioning): Assistance needed to correctly position infant at breast and maintain latch. Intervention(s): Breastfeeding basics reviewed;Support Pillows;Position options;Skin to skin  LATCH Score: 5  Lactation Tools  Discussed/Used Tools: Nipple Shields Nipple shield size: 20;24   Consult Status Consult Status: Follow-up Date: 10/05/15 Follow-up type: In-patient    Geralynn OchsWILLIARD, Zanyla Klebba 10/04/2015, 4:06 PM

## 2015-10-04 NOTE — Anesthesia Postprocedure Evaluation (Signed)
Anesthesia Post Note  Patient: Rachel Blevins  Procedure(s) Performed: * No procedures listed *  Patient location during evaluation: Mother Baby Anesthesia Type: Epidural Level of consciousness: awake and alert and oriented Pain management: satisfactory to patient Vital Signs Assessment: post-procedure vital signs reviewed and stable Respiratory status: spontaneous breathing and nonlabored ventilation Cardiovascular status: stable Postop Assessment: no headache, no backache, no signs of nausea or vomiting, adequate PO intake and patient able to bend at knees (patient up walking) Anesthetic complications: no    Last Vitals:  Filed Vitals:   10/04/15 0653 10/04/15 0748  BP: 129/70 140/78  Pulse: 63 71  Temp:  36.9 C  Resp: 16 18    Last Pain:  Filed Vitals:   10/04/15 0847  PainSc: Asleep                 Silvia Markuson

## 2015-10-04 NOTE — Addendum Note (Signed)
Addendum  created 10/04/15 0851 by Shanon PayorSuzanne M Keene Gilkey, CRNA   Modules edited: Charges VN, Clinical Notes   Clinical Notes:  File: [id number]

## 2015-10-05 LAB — CBC
HCT: 31.1 % — ABNORMAL LOW (ref 36.0–46.0)
HEMOGLOBIN: 9.9 g/dL — AB (ref 12.0–15.0)
MCH: 26.4 pg (ref 26.0–34.0)
MCHC: 31.8 g/dL (ref 30.0–36.0)
MCV: 82.9 fL (ref 78.0–100.0)
PLATELETS: 255 10*3/uL (ref 150–400)
RBC: 3.75 MIL/uL — AB (ref 3.87–5.11)
RDW: 17.3 % — ABNORMAL HIGH (ref 11.5–15.5)
WBC: 12 10*3/uL — ABNORMAL HIGH (ref 4.0–10.5)

## 2015-10-05 LAB — COMPREHENSIVE METABOLIC PANEL
ALBUMIN: 2.2 g/dL — AB (ref 3.5–5.0)
ALT: 24 U/L (ref 14–54)
AST: 22 U/L (ref 15–41)
Alkaline Phosphatase: 92 U/L (ref 38–126)
Anion gap: 6 (ref 5–15)
BUN: 14 mg/dL (ref 6–20)
CHLORIDE: 106 mmol/L (ref 101–111)
CO2: 26 mmol/L (ref 22–32)
CREATININE: 0.78 mg/dL (ref 0.44–1.00)
Calcium: 8.1 mg/dL — ABNORMAL LOW (ref 8.9–10.3)
GFR calc Af Amer: >60 mL/min (ref >60)
GFR calc non Af Amer: >60 mL/min (ref >60)
Glucose, Bld: 109 mg/dL — ABNORMAL HIGH (ref 65–99)
POTASSIUM: 4.9 mmol/L (ref 3.5–5.1)
SODIUM: 138 mmol/L (ref 135–145)
Total Bilirubin: 0.4 mg/dL (ref 0.3–1.2)
Total Protein: 4.9 g/dL — ABNORMAL LOW (ref 6.5–8.1)

## 2015-10-05 MED ORDER — LABETALOL HCL 300 MG PO TABS: 400.0000 mg | ORAL_TABLET | Freq: Three times a day (TID) | ORAL | Status: DC

## 2015-10-05 MED ORDER — LABETALOL HCL 100 MG PO TABS
200.0000 mg | ORAL_TABLET | Freq: Three times a day (TID) | ORAL | Status: DC
  Administered 2015-10-05 – 2015-10-06 (×4): 200 mg via ORAL
  Filled 2015-10-05 (×4): qty 2

## 2015-10-05 NOTE — Progress Notes (Addendum)
Post Partum Day 2 Subjective: no complaints, up ad lib, voiding and tolerating PO.  No HA, CP/SOB, RUQ pain, or visual disturbance.  Baby in NICU.  Objective: Blood pressure 148/82, pulse 60, temperature 98.4 F (36.9 C), temperature source Oral, resp. rate 18, height 5\' 3"  (1.6 m), weight 107.502 kg (237 lb), SpO2 99 %, unknown if currently breastfeeding.  Physical Exam:  General: alert, cooperative and appears stated age Lochia: appropriate Uterine Fundus: firm Incision: n/a DVT Evaluation: No evidence of DVT seen on physical exam. Negative Homan's sign. No cords or calf tenderness. Calf/Ankle edema is present, improved DTRs 1+   Recent Labs  10/03/15 0733 10/04/15 0515  HGB 11.2* 9.8*  HCT 33.8* 30.5*    Assessment/Plan: Plan for discharge tomorrow and Breastfeeding  AM labs pending Pre-E with severe features-continue to monitor BPs.  Continue labetalol 200 tid   LOS: 4 days   Jamiyla Ishee 10/05/2015, 8:24 AM

## 2015-10-05 NOTE — Lactation Note (Signed)
This note was copied from the chart of Rachel Blevins. Lactation Consultation Note  Mom is now pumping 25-30 mls of transitional milk.  Assisted in NICU with breastfeeding.  Mom has large nipples and areolar tissue is edematous.  Baby opens and shows interest but unable to sustain latch.  20 mm nipple shield applied and baby latched well and actively nursed.  Stimulation and breast massage were helpful keeping baby engaged.  Instructed mom to put a bra and shells on after feeding.  Instructed to limit feedings to 30 minutes.  20 minutes at breast and then supplementation with bottle.  Discussed pre and post weights in a few days.  Encouraged to call with questions/concerns.  Patient Name: Rachel Blevins ZOXWR'UToday's Date: 10/05/2015 Reason for consult: Follow-up assessment;NICU baby   Maternal Data    Feeding Feeding Type: Breast Fed Nipple Type: Slow - flow Length of feed: 20 min  LATCH Score/Interventions Latch: Grasps breast easily, tongue down, lips flanged, rhythmical sucking. (with a 20 mm nipple shield) Intervention(s): Skin to skin;Teach feeding cues;Waking techniques Intervention(s): Breast compression;Breast massage;Assist with latch;Adjust position  Audible Swallowing: Spontaneous and intermittent  Type of Nipple: Everted at rest and after stimulation  Comfort (Breast/Nipple): Soft / non-tender     Hold (Positioning): Assistance needed to correctly position infant at breast and maintain latch. Intervention(s): Breastfeeding basics reviewed;Support Pillows;Position options;Skin to skin  LATCH Score: 9  Lactation Tools Discussed/Used Tools: Shells Nipple shield size: 20   Consult Status Consult Status: Follow-up Date: 10/06/15    Huston FoleyMOULDEN, Bekah Igoe S 10/05/2015, 12:32 PM

## 2015-10-05 NOTE — Progress Notes (Signed)
Pt ambulated off unit with significant other to NICU.

## 2015-10-06 MED ORDER — HYDROCHLOROTHIAZIDE 25 MG PO TABS
25.0000 mg | ORAL_TABLET | Freq: Every day | ORAL | Status: DC
  Administered 2015-10-06: 25 mg via ORAL
  Filled 2015-10-06 (×2): qty 1

## 2015-10-06 MED ORDER — LABETALOL HCL 100 MG PO TABS
100.0000 mg | ORAL_TABLET | Freq: Once | ORAL | Status: AC
  Administered 2015-10-06: 100 mg via ORAL
  Filled 2015-10-06: qty 1

## 2015-10-06 MED ORDER — LABETALOL HCL 300 MG PO TABS
300.0000 mg | ORAL_TABLET | Freq: Three times a day (TID) | ORAL | Status: DC
  Administered 2015-10-06 (×2): 300 mg via ORAL
  Filled 2015-10-06 (×2): qty 1

## 2015-10-06 NOTE — Lactation Note (Signed)
This note was copied from the chart of Rachel Blevins. Lactation Consultation Note  Mom is now pumping 30 mls each pumping.  Her MD will keep her today due to elevated B/P's.  Mom to have RN call when baby starts to cue for latch assist.  Patient Name: Rachel Blevins ZOXWR'UToday's Date: 10/06/2015     Maternal Data    Feeding Feeding Type: Breast Milk Nipple Type: Slow - flow Length of feed: 25 min  LATCH Score/Interventions                      Lactation Tools Discussed/Used     Consult Status      Huston FoleyMOULDEN, Kyndra Condron S 10/06/2015, 3:42 PM

## 2015-10-06 NOTE — Progress Notes (Signed)
Pt denies HA, N/V, and abdominal pain.  Scant lochia  AF, VSS  BP 150-160s/90-104 Gen - NAD Abd - soft, NT.  Fundus firm Ext 2+ pitting edema bilaterally  A/P:  Postpartum with pre-eclampsia and HTN, baby in NICU Will increase labetalol to 300mg  tid Add HCTZ rec inpatient management until BP better controlled

## 2015-10-06 NOTE — Progress Notes (Signed)
RN called back from NICU, patient back from NICU, Labetalol po given and encouraged patient to rest until 11:30 and we would recheck BP

## 2015-10-06 NOTE — Lactation Note (Signed)
This note was copied from the chart of Boy Esmond PlantsCecilia Deluna. Lactation Consultation Note  Assisted mom with latching baby.  Positioned baby in football hold on left side.  Instructed mom how to support and compress her breast for a good latch.  Nipples are large but baby is able to latch beyond nipple.  No shield needed at this feeding.  Baby latched easily and nursed actively with swallows for 20 minutes.  Instructed mom to always post pump both breasts.  Reminded that baby may not be able to transfer a complete feeding until closer to term.  Mom very engaged and receptive to teaching.  May consider pre/post weight in next 1-2 days.  Patient Name: Boy Esmond PlantsCecilia Deluna AVWUJ'WToday's Date: 10/06/2015 Reason for consult: Follow-up assessment;NICU baby;Infant < 6lbs;Late preterm infant   Maternal Data    Feeding Feeding Type: Breast Fed Length of feed: 20 min  LATCH Score/Interventions Latch: Grasps breast easily, tongue down, lips flanged, rhythmical sucking. Intervention(s): Adjust position;Assist with latch;Breast massage;Breast compression  Audible Swallowing: Spontaneous and intermittent  Type of Nipple: Everted at rest and after stimulation  Comfort (Breast/Nipple): Soft / non-tender     Hold (Positioning): Assistance needed to correctly position infant at breast and maintain latch. Intervention(s): Breastfeeding basics reviewed  LATCH Score: 9  Lactation Tools Discussed/Used     Consult Status Consult Status: Follow-up Date: 10/07/15 Follow-up type: In-patient    Huston FoleyMOULDEN, Janiesha Diehl S 10/06/2015, 5:19 PM

## 2015-10-06 NOTE — Progress Notes (Signed)
Pt left for NICU after bp check before I could recheck.

## 2015-10-06 NOTE — Progress Notes (Signed)
Encouraged patient not to go back to NICU at this time due to BP, she was up in hall and directed her back to room to check it. She agrees to remain in bed x30 minutes and we will recheck

## 2015-10-06 NOTE — Progress Notes (Signed)
CSW attempted to meet with MOB to introduce services, offer support, and complete assessment due to NICU admission, but she was not in her room at this time.  CSW will attempt again at a later time. 

## 2015-10-07 MED ORDER — LABETALOL HCL 200 MG PO TABS: 400.0000 mg | ORAL_TABLET | Freq: Three times a day (TID) | ORAL | Status: DC

## 2015-10-07 MED ORDER — HYDRALAZINE HCL 20 MG/ML IJ SOLN
10.0000 mg | Freq: Once | INTRAMUSCULAR | Status: AC
  Administered 2015-10-07: 10 mg via INTRAVENOUS

## 2015-10-07 MED ORDER — HYDROCHLOROTHIAZIDE 25 MG PO TABS: 25.0000 mg | ORAL_TABLET | Freq: Every day | ORAL | Status: DC

## 2015-10-07 MED ORDER — HYDRALAZINE HCL 20 MG/ML IJ SOLN
INTRAMUSCULAR | Status: AC
  Administered 2015-10-07: 10 mg via INTRAVENOUS
  Filled 2015-10-07: qty 1

## 2015-10-07 MED ORDER — IBUPROFEN 600 MG PO TABS: 600.0000 mg | ORAL_TABLET | Freq: Four times a day (QID) | ORAL | Status: DC | PRN

## 2015-10-07 NOTE — Discharge Summary (Signed)
Obstetric Discharge Summary Reason for Admission: induction of labor Prenatal Procedures: Preeclampsia Intrapartum Procedures: spontaneous vaginal delivery Postpartum Procedures: MgSO4/labetalol Complications-Operative and Postpartum: none HEMOGLOBIN  Date Value Ref Range Status  10/05/2015 9.9* 12.0 - 15.0 g/dL Final   HCT  Date Value Ref Range Status  10/05/2015 31.1* 36.0 - 46.0 % Final    Physical Exam:  General: alert Lochia: appropriate Uterine Fundus: firm Incision: healing well DVT Evaluation: No evidence of DVT seen on physical exam.  Discharge Diagnoses: Term Pregnancy-delivered and Preelampsia  Discharge Information: Date: 10/07/2015 Activity: pelvic rest Diet: routine Medications: PNV, Ibuprofen and HCTZ/Labetalol Condition: stable Instructions: refer to practice specific booklet Discharge to: home Follow-up Information    Follow up with Meriel PicaHOLLAND,Rachel Shoaff M, MD.   Specialty:  Obstetrics and Gynecology   Why:  office will call for f/u 2 days   Contact information:   [address] [place] [id number] [phone]       Newborn Data: Live born female  Birth Weight: 5 lb 6.8 oz (2460 g) APGAR: 6, 8  Home with in NICU.  Rachel Blevins 10/07/2015, 8:50 AM

## 2015-10-07 NOTE — Lactation Note (Signed)
This note was copied from the chart of Boy Esmond PlantsCecilia Deluna. Lactation Consultation Note  Patient Name: Boy Esmond PlantsCecilia Deluna ZOXWR'UToday's Date: 10/07/2015   NICU baby 544 days old. Mom at baby's bedside giving bottle of EBM. Mom reports that she is having a hard time keeping up with baby's demand for EBM. Mom states that she is pumping routinely and her supply is increasing, just not as fast as baby's demand. Mom's BP is elevated, and her eyes are red with broken blood vessels. Mom states that her HCP is following her BP closely. Enc mom to pump now that she has fed the baby. Enc mom to call at next feeding for assistance with latching as needed.   Maternal Data    Feeding    LATCH Score/Interventions                      Lactation Tools Discussed/Used     Consult Status      Geralynn OchsWILLIARD, Lionel Woodberry 10/07/2015, 8:59 AM

## 2015-10-07 NOTE — Discharge Instructions (Addendum)
Postpartum Hypertension  Postpartum hypertension is high blood pressure after pregnancy that remains higher than normal for more than two days after delivery. You may not realize that you have postpartum hypertension if your blood pressure is not being checked regularly. In some cases, postpartum hypertension will go away on its own, usually within a week of delivery. However, for some women, medical treatment is required to prevent serious complications, such as seizures or stroke.  The following things can affect your blood pressure:  · The type of delivery you had.  · Having received IV fluids or other medicines during or after delivery.  CAUSES   Postpartum hypertension may be caused by any of the following or by a combination of any of the following:  · Hypertension that existed before pregnancy (chronic hypertension).  · Gestational hypertension.  · Preeclampsia or eclampsia.  · Receiving a lot of fluid through an IV during or after delivery.  · Medicines.  · HELLP syndrome.  · Hyperthyroidism.  · Stroke.  · Other rare neurological or blood disorders.  In some cases, the cause may not be known.  RISK FACTORS  Postpartum hypertension can be related to one or more risk factors, such as:  · Chronic hypertension. In some cases, this may not have been diagnosed before pregnancy.  · Obesity.  · Type 2 diabetes.  · Kidney disease.  · Family history of preeclampsia.  · Other medical conditions that cause hormonal imbalances.  SIGNS AND SYMPTOMS  As with all types of hypertension, postpartum hypertension may not have any symptoms. Depending on how high your blood pressure is, you may experience:  · Headaches. These may be mild, moderate, or severe. They may also be steady, constant, or sudden in onset (thunderclap headache).  · Visual changes.  · Dizziness.  · Shortness of breath.  · Swelling of your hands, feet, lower legs, or face. In some cases, you may have swelling in more than one of these locations.  · Heart  palpitations or a racing heartbeat.  · Difficulty breathing while lying down.  · Decreased urination.  Other rare signs and symptoms may include:  · Sweating more than usual. This lasts longer than a few days after delivery.  · Chest pain.  · Sudden dizziness when you get up from sitting or lying down.  · Seizures.  · Nausea or vomiting.  · Abdominal pain.  DIAGNOSIS  The diagnosis of postpartum hypertension is made through a combination of physical examination findings and testing of your blood and urine. You may also have additional tests, such as a CT scan or an MRI, to check for other complications of postpartum hypertension.  TREATMENT  When blood pressure is high enough to require treatment, your options may include:  · Medicines to reduce blood pressure (antihypertensives). Tell your health care provider if you are breastfeeding or if you plan to breastfeed. There are many antihypertensive medicines that are safe to take while breastfeeding.  · Stopping medicines that may be causing hypertension.  · Treating medical conditions that are causing hypertension.  · Treating the complications of hypertension, such as seizures, stroke, or kidney problems.  Your health care provider will also continue to monitor your blood pressure closely and repeatedly until it is within a safe range for you.   HOME CARE INSTRUCTIONS  · Take medicines only as directed by your health care provider.  · Get regular exercise after your health care provider tells you that it is safe.  · Follow   Your symptoms get worse.  You have new symptoms, such as:  Headache.  Dizziness.  Visual  changes. SEEK IMMEDIATE MEDICAL CARE IF:  You develop a severe or sudden headache.  You have seizures.  You develop numbness or weakness on one side of your body.  You have difficulty thinking, speaking, or swallowing.  You develop severe abdominal pain.  You develop difficulty breathing, chest pain, a racing heartbeat, or heart palpitations. These symptoms may represent a serious problem that is an emergency. Do not wait to see if the symptoms will go away. Get medical help right away. Call your local emergency services (911 in the [place] Do not drive yourself to the hospital.   This information is not intended to replace advice given to you by your health care provider. Make sure you discuss any questions you have with your health care provider.   Document Released: 05/23/2014 Document Reviewed: 05/23/2014 Elsevier Interactive Patient Education 2016 [employer] Postpartum Care After Vaginal Delivery After you deliver your newborn (postpartum period), the usual stay in the hospital is 24-72 hours. If there were problems with your labor or delivery, or if you have other medical problems, you might be in the hospital longer.  While you are in the hospital, you will receive help and instructions on how to care for yourself and your newborn during the postpartum period.  While you are in the hospital:  Be sure to tell your nurses if you have pain or discomfort, as well as where you feel the pain and what makes the pain worse.  If you had an incision made near your vagina (episiotomy) or if you had some tearing during delivery, the nurses may put ice packs on your episiotomy or tear. The ice packs may help to reduce the pain and swelling.  If you are breastfeeding, you may feel uncomfortable contractions of your uterus for a couple of weeks. This is normal. The contractions help your uterus get back to normal size.  It is normal to have some bleeding after delivery.  For the first 1-3  days after delivery, the flow is red and the amount may be similar to a period.  It is common for the flow to start and stop.  In the first few days, you may pass some small clots. Let your nurses know if you begin to pass large clots or your flow increases.  Do not  flush blood clots down the toilet before having the nurse look at them.  During the next 3-10 days after delivery, your flow should become more watery and pink or brown-tinged in color.  Ten to fourteen days after delivery, your flow should be a small amount of yellowish-white discharge.  The amount of your flow will decrease over the first few weeks after delivery. Your flow may stop in 6-8 weeks. Most women have had their flow stop by 12 weeks after delivery.  You should change your sanitary pads frequently.  Wash your hands thoroughly with soap and water for at least 20 seconds after changing pads, using the toilet, or before holding or feeding your newborn.  You should feel like you need to empty your bladder within the first 6-8 hours after delivery.  In case you become weak, lightheaded, or faint, call your nurse before you get out of bed for the first time and before you take a shower for the first time.  Within the first few days after delivery, your breasts may begin to feel tender and full.  This is called engorgement. Breast tenderness usually goes away within 48-72 hours after engorgement occurs. You may also notice milk leaking from your breasts. If you are not breastfeeding, do not stimulate your breasts. Breast stimulation can make your breasts produce more milk.  Spending as much time as possible with your newborn is very important. During this time, you and your newborn can feel close and get to know each other. Having your newborn stay in your room (rooming in) will help to strengthen the bond with your newborn. It will give you time to get to know your newborn and become comfortable caring for your  newborn.  Your hormones change after delivery. Sometimes the hormone changes can temporarily cause you to feel sad or tearful. These feelings should not last more than a few days. If these feelings last longer than that, you should talk to your caregiver.  If desired, talk to your caregiver about methods of family planning or contraception.  Talk to your caregiver about immunizations. Your caregiver may want you to have the following immunizations before leaving the hospital:  Tetanus, diphtheria, and pertussis (Tdap) or tetanus and diphtheria (Td) immunization. It is very important that you and your family (including grandparents) or others caring for your newborn are up-to-date with the Tdap or Td immunizations. The Tdap or Td immunization can help protect your newborn from getting ill.  Rubella immunization.  Varicella (chickenpox) immunization.  Influenza immunization. You should receive this annual immunization if you did not receive the immunization during your pregnancy.   This information is not intended to replace advice given to you by your health care provider. Make sure you discuss any questions you have with your health care provider.   Document Released: 07/17/2007 Document Revised: 06/13/2012 Document Reviewed: 05/16/2012 Elsevier Interactive Patient Education 2016 [employer] Postpartum Care After Vaginal Delivery After you deliver your newborn (postpartum period), the usual stay in the hospital is 24-72 hours. If there were problems with your labor or delivery, or if you have other medical problems, you might be in the hospital longer.  While you are in the hospital, you will receive help and instructions on how to care for yourself and your newborn during the postpartum period.  While you are in the hospital:  Be sure to tell your nurses if you have pain or discomfort, as well as where you feel the pain and what makes the pain worse.  If you had an incision made near  your vagina (episiotomy) or if you had some tearing during delivery, the nurses may put ice packs on your episiotomy or tear. The ice packs may help to reduce the pain and swelling.  If you are breastfeeding, you may feel uncomfortable contractions of your uterus for a couple of weeks. This is normal. The contractions help your uterus get back to normal size.  It is normal to have some bleeding after delivery.  For the first 1-3 days after delivery, the flow is red and the amount may be similar to a period.  It is common for the flow to start and stop.  In the first few days, you may pass some small clots. Let your nurses know if you begin to pass large clots or your flow increases.  Do not  flush blood clots down the toilet before having the nurse look at them.  During the next 3-10 days after delivery, your flow should become more watery and pink or brown-tinged in color.  Ten to fourteen days after  delivery, your flow should be a small amount of yellowish-white discharge.  The amount of your flow will decrease over the first few weeks after delivery. Your flow may stop in 6-8 weeks. Most women have had their flow stop by 12 weeks after delivery.  You should change your sanitary pads frequently.  Wash your hands thoroughly with soap and water for at least 20 seconds after changing pads, using the toilet, or before holding or feeding your newborn.  You should feel like you need to empty your bladder within the first 6-8 hours after delivery.  In case you become weak, lightheaded, or faint, call your nurse before you get out of bed for the first time and before you take a shower for the first time.  Within the first few days after delivery, your breasts may begin to feel tender and full. This is called engorgement. Breast tenderness usually goes away within 48-72 hours after engorgement occurs. You may also notice milk leaking from your breasts. If you are not breastfeeding, do not  stimulate your breasts. Breast stimulation can make your breasts produce more milk.  Spending as much time as possible with your newborn is very important. During this time, you and your newborn can feel close and get to know each other. Having your newborn stay in your room (rooming in) will help to strengthen the bond with your newborn. It will give you time to get to know your newborn and become comfortable caring for your newborn.  Your hormones change after delivery. Sometimes the hormone changes can temporarily cause you to feel sad or tearful. These feelings should not last more than a few days. If these feelings last longer than that, you should talk to your caregiver.  If desired, talk to your caregiver about methods of family planning or contraception.  Talk to your caregiver about immunizations. Your caregiver may want you to have the following immunizations before leaving the hospital:  Tetanus, diphtheria, and pertussis (Tdap) or tetanus and diphtheria (Td) immunization. It is very important that you and your family (including grandparents) or others caring for your newborn are up-to-date with the Tdap or Td immunizations. The Tdap or Td immunization can help protect your newborn from getting ill.  Rubella immunization.  Varicella (chickenpox) immunization.  Influenza immunization. You should receive this annual immunization if you did not receive the immunization during your pregnancy.   This information is not intended to replace advice given to you by your health care provider. Make sure you discuss any questions you have with your health care provider.   Document Released: 07/17/2007 Document Revised: 06/13/2012 Document Reviewed: 05/16/2012 Elsevier Interactive Patient Education 2016 [employer]. Postpartum Care After Vaginal Delivery After you deliver your newborn (postpartum period), the usual stay in the hospital is 24-72 hours. If there were problems with your labor or  delivery, or if you have other medical problems, you might be in the hospital longer.  While you are in the hospital, you will receive help and instructions on how to care for yourself and your newborn during the postpartum period.  While you are in the hospital:  Be sure to tell your nurses if you have pain or discomfort, as well as where you feel the pain and what makes the pain worse.  If you had an incision made near your vagina (episiotomy) or if you had some tearing during delivery, the nurses may put ice packs on your episiotomy or tear. The ice packs may help to reduce  the pain and swelling.  If you are breastfeeding, you may feel uncomfortable contractions of your uterus for a couple of weeks. This is normal. The contractions help your uterus get back to normal size.  It is normal to have some bleeding after delivery.  For the first 1-3 days after delivery, the flow is red and the amount may be similar to a period.  It is common for the flow to start and stop.  In the first few days, you may pass some small clots. Let your nurses know if you begin to pass large clots or your flow increases.  Do not  flush blood clots down the toilet before having the nurse look at them.  During the next 3-10 days after delivery, your flow should become more watery and pink or brown-tinged in color.  Ten to fourteen days after delivery, your flow should be a small amount of yellowish-white discharge.  The amount of your flow will decrease over the first few weeks after delivery. Your flow may stop in 6-8 weeks. Most women have had their flow stop by 12 weeks after delivery.  You should change your sanitary pads frequently.  Wash your hands thoroughly with soap and water for at least 20 seconds after changing pads, using the toilet, or before holding or feeding your newborn.  You should feel like you need to empty your bladder within the first 6-8 hours after delivery.  In case you become weak,  lightheaded, or faint, call your nurse before you get out of bed for the first time and before you take a shower for the first time.  Within the first few days after delivery, your breasts may begin to feel tender and full. This is called engorgement. Breast tenderness usually goes away within 48-72 hours after engorgement occurs. You may also notice milk leaking from your breasts. If you are not breastfeeding, do not stimulate your breasts. Breast stimulation can make your breasts produce more milk.  Spending as much time as possible with your newborn is very important. During this time, you and your newborn can feel close and get to know each other. Having your newborn stay in your room (rooming in) will help to strengthen the bond with your newborn. It will give you time to get to know your newborn and become comfortable caring for your newborn.  Your hormones change after delivery. Sometimes the hormone changes can temporarily cause you to feel sad or tearful. These feelings should not last more than a few days. If these feelings last longer than that, you should talk to your caregiver.  If desired, talk to your caregiver about methods of family planning or contraception.  Talk to your caregiver about immunizations. Your caregiver may want you to have the following immunizations before leaving the hospital:  Tetanus, diphtheria, and pertussis (Tdap) or tetanus and diphtheria (Td) immunization. It is very important that you and your family (including grandparents) or others caring for your newborn are up-to-date with the Tdap or Td immunizations. The Tdap or Td immunization can help protect your newborn from getting ill.  Rubella immunization.  Varicella (chickenpox) immunization.  Influenza immunization. You should receive this annual immunization if you did not receive the immunization during your pregnancy.   This information is not intended to replace advice given to you by your health  care provider. Make sure you discuss any questions you have with your health care provider.   Document Released: 07/17/2007 Document Revised: 06/13/2012 Document Reviewed: 05/16/2012 Elsevier  Interactive Patient Education ©2016 Elsevier Inc. ° °

## 2015-10-12 ENCOUNTER — Ambulatory Visit: Payer: Self-pay

## 2015-10-12 NOTE — Lactation Note (Signed)
This note was copied from the chart of Rachel Blevins. Lactation Consultation Note  Follow up visit made.  Mom states supply is good. Offered assist today with latching baby to breast.  Mom interested and will have nurse page me.  Patient Name: Rachel Blevins RUEAV'WToday's Date: 10/12/2015     Maternal Data    Feeding    LATCH Score/Interventions                      Lactation Tools Discussed/Used     Consult Status      Huston FoleyMOULDEN, Linnae Rasool S 10/12/2015, 11:16 AM

## 2015-11-12 DIAGNOSIS — Z1389 Encounter for screening for other disorder: Secondary | ICD-10-CM | POA: Diagnosis not present

## 2015-12-01 DIAGNOSIS — Z30430 Encounter for insertion of intrauterine contraceptive device: Secondary | ICD-10-CM | POA: Diagnosis not present

## 2016-12-16 ENCOUNTER — Ambulatory Visit (INDEPENDENT_AMBULATORY_CARE_PROVIDER_SITE_OTHER): Payer: Self-pay | Admitting: Physician Assistant

## 2016-12-21 ENCOUNTER — Ambulatory Visit (INDEPENDENT_AMBULATORY_CARE_PROVIDER_SITE_OTHER): Payer: Self-pay | Admitting: Physician Assistant

## 2017-03-30 ENCOUNTER — Encounter (INDEPENDENT_AMBULATORY_CARE_PROVIDER_SITE_OTHER): Payer: Self-pay | Admitting: Physician Assistant

## 2017-03-30 ENCOUNTER — Ambulatory Visit (INDEPENDENT_AMBULATORY_CARE_PROVIDER_SITE_OTHER): Payer: Self-pay | Admitting: Physician Assistant

## 2017-03-30 VITALS — BP 129/91 | HR 60 | Temp 98.0°F | Ht 63.5 in | Wt 225.0 lb

## 2017-03-30 DIAGNOSIS — L308 Other specified dermatitis: Secondary | ICD-10-CM

## 2017-03-30 DIAGNOSIS — R030 Elevated blood-pressure reading, without diagnosis of hypertension: Secondary | ICD-10-CM

## 2017-03-30 MED ORDER — TRIAMCINOLONE ACETONIDE 0.5 % EX CREA: 1.0000 "application " | TOPICAL_CREAM | Freq: Two times a day (BID) | CUTANEOUS | 0 refills | Status: AC

## 2017-03-30 MED ORDER — PREDNISONE 20 MG PO TABS: 20.0000 mg | ORAL_TABLET | Freq: Every day | ORAL | 0 refills | Status: DC

## 2017-03-30 NOTE — Progress Notes (Signed)
Subjective:  Patient ID: Rachel Blevins, female    DOB: July 11, 1991  Age: 26 y.o. MRN: [id number]  CC: rash  HPI Rachel Blevins is a 26 y.o. female with a PMH of anemia presents with digital and interdigital rash on left hand. Rash onset a few months ago. Has tried Hydrocortisone and prescribed steroid cream. Has also tried Lamisil but rash persists. Rash characterized as pruritic with desquamation and tiny papules. Worse when washing dishes. Says she has not used gloves or moisturizers. 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 Does not complain of any other symptoms or complaints.     BP found to be elevated today. However, pt states her blood pressure is usually low at home. Had preeclampsia and was prescribed two anti-hypertensives but has completed therapy. Not on any anti-hypertensives. Reports no cardiovascular or pulmonary symptoms.    Outpatient Medications Prior to Visit  Medication Sig Dispense Refill  . Prenatal Vit-Fe Fumarate-FA (PRENATAL MULTIVITAMIN) TABS tablet Take 1 tablet by mouth daily at 12 noon.    . hydrochlorothiazide (HYDRODIURIL) 25 MG tablet Take 1 tablet (25 mg total) by mouth daily. (Patient not taking: Reported on 03/30/2017) 10 tablet 0  . ibuprofen (ADVIL,MOTRIN) 600 MG tablet Take 1 tablet (600 mg total) by mouth every 6 (six) hours as needed for moderate pain. (Patient not taking: Reported on 03/30/2017) 30 tablet 1  . labetalol (NORMODYNE) 200 MG tablet Take 2 tablets (400 mg total) by mouth 3 (three) times daily. (Patient not taking: Reported on 03/30/2017) 60 tablet 1   No facility-administered medications prior to visit.      ROS Review of Systems  Constitutional: Negative for chills, fever and malaise/fatigue.  Eyes: Negative for blurred vision.  Respiratory: Negative for shortness of breath.   Cardiovascular: Negative for chest pain and palpitations.  Gastrointestinal: Negative for abdominal pain and nausea.   Genitourinary: Negative for dysuria and hematuria.  Musculoskeletal: Negative for joint pain and myalgias.  Skin: Positive for rash.  Neurological: Negative for tingling and headaches.  Psychiatric/Behavioral: Negative for depression. The patient is not nervous/anxious.     Objective:  BP (!) 129/91 (BP Location: Left Arm, Patient Position: Sitting, Cuff Size: Large)   Pulse 60   Temp 98 F (36.7 C) (Oral)   Ht 5' 3.5" (1.613 m)   Wt 225 lb (102.1 kg)   SpO2 99%   Breastfeeding? No   BMI 39.23 kg/m   BP/Weight 03/30/2017 10/07/2015 10/03/2015  Systolic BP 129 141 -  Diastolic BP 91 92 -  Wt. (Lbs) 225 - 237  BMI 39.23 - 41.99      Physical Exam  Constitutional: She is oriented to person, place, and time.  Well developed, obese, NAD, polite  HENT:  Head: Normocephalic and atraumatic.  Eyes: No scleral icterus.  Cardiovascular: Normal rate, regular rhythm and normal heart sounds.   Pulmonary/Chest: Effort normal and breath sounds normal.  Musculoskeletal: She exhibits no edema.  Neurological: She is alert and oriented to person, place, and time.  Skin: Skin is warm and dry.  Left 3rd and 4th fingers with a patch of desquamation, mild erythema, and tiny papules affecting the dorsal and interdigital aspect of the fingers.  Psychiatric: She has a normal mood and affect. Her behavior is normal. Thought content normal.  Vitals reviewed.    Assessment & Plan:   1. Other eczema - Begin predniSONE (DELTASONE) 20 MG tablet; Take 1 tablet (20 mg total) by mouth daily with breakfast.  Dispense:  10 tablet; Refill: 0 - Begin triamcinolone cream (KENALOG) 0.5 %; Apply 1 application topically 2 (two) times daily. Do not use for more than 7 consecutive days without medical approval.  Dispense: 30 g; Refill: 0  2. Elevated blood-pressure reading without diagnosis of hypertension    Meds ordered this encounter  Medications  . predniSONE (DELTASONE) 20 MG tablet    Sig: Take 1  tablet (20 mg total) by mouth daily with breakfast.    Dispense:  10 tablet    Refill:  0    Order Specific Question:   Supervising Provider    Answer:   Quentin Angst [id number]  . triamcinolone cream (KENALOG) 0.5 %    Sig: Apply 1 application topically 2 (two) times daily. Do not use for more than 7 consecutive days without medical approval.    Dispense:  30 g    Refill:  0    Order Specific Question:   Supervising Provider    Answer:   Quentin Angst [[id number]]    Follow-up: Return if symptoms worsen or fail to improve.   Loletta Specter PA

## 2017-03-30 NOTE — Patient Instructions (Addendum)
You have dyshidrotic eczema. Please apply the steroid cream that was prescribed and soon after apply a moisturizer such as Cetaphil. Wear gloves when washing dishes. Do not frequently apply alcohol hand wash.   Eczema Eczema, also called atopic dermatitis, is a skin disorder that causes inflammation of the skin. It causes a red rash and dry, scaly skin. The skin becomes very itchy. Eczema is generally worse during the cooler winter months and often improves with the warmth of summer. Eczema usually starts showing signs in infancy. Some children outgrow eczema, but it may last through adulthood. What are the causes? The exact cause of eczema is not known, but it appears to run in families. People with eczema often have a family history of eczema, allergies, asthma, or hay fever. Eczema is not contagious. Flare-ups of the condition may be caused by:  Contact with something you are sensitive or allergic to.  Stress.  What are the signs or symptoms?  Dry, scaly skin.  Red, itchy rash.  Itchiness. This may occur before the skin rash and may be very intense. How is this diagnosed? The diagnosis of eczema is usually made based on symptoms and medical history. How is this treated? Eczema cannot be cured, but symptoms usually can be controlled with treatment and other strategies. A treatment plan might include:  Controlling the itching and scratching. ? Use over-the-counter antihistamines as directed for itching. This is especially useful at night when the itching tends to be worse. ? Use over-the-counter steroid creams as directed for itching. ? Avoid scratching. Scratching makes the rash and itching worse. It may also result in a skin infection (impetigo) due to a break in the skin caused by scratching.  Keeping the skin well moisturized with creams every day. This will seal in moisture and help prevent dryness. Lotions that contain alcohol and water should be avoided because they can dry the  skin.  Limiting exposure to things that you are sensitive or allergic to (allergens).  Recognizing situations that cause stress.  Developing a plan to manage stress.  Follow these instructions at home:  Only take over-the-counter or prescription medicines as directed by your health care provider.  Do not use anything on the skin without checking with your health care provider.  Keep baths or showers short (5 minutes) in warm (not hot) water. Use mild cleansers for bathing. These should be unscented. You may add nonperfumed bath oil to the bath water. It is best to avoid soap and bubble bath.  Immediately after a bath or shower, when the skin is still damp, apply a moisturizing ointment to the entire body. This ointment should be a petroleum ointment. This will seal in moisture and help prevent dryness. The thicker the ointment, the better. These should be unscented.  Keep fingernails cut short. Children with eczema may need to wear soft gloves or mittens at night after applying an ointment.  Dress in clothes made of cotton or cotton blends. Dress lightly, because heat increases itching.  A child with eczema should stay away from anyone with fever blisters or cold sores. The virus that causes fever blisters (herpes simplex) can cause a serious skin infection in children with eczema. Contact a health care provider if:  Your itching interferes with sleep.  Your rash gets worse or is not better within 1 week after starting treatment.  You see pus or soft yellow scabs in the rash area.  You have a fever.  You have a rash flare-up after contact  with someone who has fever blisters. This information is not intended to replace advice given to you by your health care provider. Make sure you discuss any questions you have with your health care provider. Document Released: 09/16/2000 Document Revised: 02/25/2016 Document Reviewed: 04/22/2013 Elsevier Interactive Patient Education  2017  [employer].

## 2019-01-13 ENCOUNTER — Ambulatory Visit (HOSPITAL_COMMUNITY)
Admission: EM | Admit: 2019-01-13 | Discharge: 2019-01-13 | Disposition: A | Discharge status: Home / Self Care | Payer: Self-pay

## 2019-01-13 ENCOUNTER — Encounter (HOSPITAL_COMMUNITY): Payer: Self-pay | Admitting: *Deleted

## 2019-01-13 ENCOUNTER — Other Ambulatory Visit: Payer: Self-pay

## 2019-01-13 DIAGNOSIS — J351 Hypertrophy of tonsils: Secondary | ICD-10-CM | POA: Insufficient documentation

## 2019-01-13 DIAGNOSIS — J029 Acute pharyngitis, unspecified: Secondary | ICD-10-CM | POA: Insufficient documentation

## 2019-01-13 LAB — POCT RAPID STREP A: Streptococcus, Group A Screen (Direct): NEGATIVE

## 2019-01-13 MED ORDER — AMOXICILLIN 500 MG PO CAPS: 500.0000 mg | ORAL_CAPSULE | Freq: Two times a day (BID) | ORAL | 0 refills | Status: DC

## 2019-01-13 NOTE — ED Notes (Signed)
Patient verbalizes understanding of discharge instructions. Opportunity for questioning and answers were provided. Patient discharged from [facility] by provider.  

## 2019-01-13 NOTE — Discharge Instructions (Signed)
You may take 500mg Tylenol with ibuprofen 400-600mg every 6 hours for pain and inflammation. °

## 2019-01-13 NOTE — ED Triage Notes (Signed)
Reports sore throat x 2 days; had negative Covid result today from [place].  Spouse diagnosed with strep this past wk.  Denies fevers.

## 2019-01-13 NOTE — ED Provider Notes (Signed)
MRN: [id number] DOB: Jun 15, 1991  Subjective:   Rachel Blevins is a 28 y.o. female presenting for 4 day history of acute onset worsening sharp constant throat pain with difficulty swallowing.  Patient reports that she has used ibuprofen with minimal relief.  Her husband was recently treated and hospitalized for a throat infection as well.  A history of throat infections and enlarged tonsils.   Allergies  Allergen Reactions  . Mango Flavor [Flavoring Agent] Anaphylaxis, Itching and Swelling    Past Medical History:  Diagnosis Date  . Anemia      Past Surgical History:  Procedure Laterality Date  . NO PAST SURGERIES      Review of Systems  Constitutional: Positive for fever and malaise/fatigue.  HENT: Positive for sore throat. Negative for congestion, ear pain and sinus pain.   Eyes: Negative for blurred vision, double vision, discharge and redness.  Respiratory: Negative for cough, hemoptysis, shortness of breath and wheezing.   Cardiovascular: Negative for chest pain.  Gastrointestinal: Negative for abdominal pain, diarrhea, nausea and vomiting.  Genitourinary: Negative for dysuria, flank pain and hematuria.  Musculoskeletal: Negative for myalgias.  Skin: Negative for rash.  Neurological: Negative for weakness and headaches.  Psychiatric/Behavioral: Negative for depression and substance abuse.    Objective:   Vitals: BP (!) 142/92   Pulse 72   Temp 98.4 F (36.9 C) (Oral)   Resp 16   SpO2 100%   Physical Exam Constitutional:      General: She is not in acute distress.    Appearance: Normal appearance. She is well-developed. She is not ill-appearing.  HENT:     Head: Normocephalic and atraumatic.     Right Ear: Tympanic membrane and ear canal normal. No drainage or tenderness. No middle ear effusion. Tympanic membrane is not erythematous.     Left Ear: Tympanic membrane and ear canal normal. No drainage or tenderness.  No middle ear effusion. Tympanic membrane is  not erythematous.     Nose: Nose normal. No congestion or rhinorrhea.     Mouth/Throat:     Mouth: Mucous membranes are moist. No oral lesions.     Pharynx: Pharyngeal swelling and posterior oropharyngeal erythema present. No oropharyngeal exudate or uvula swelling.     Tonsils: No tonsillar exudate or tonsillar abscesses.  Eyes:     General: No scleral icterus.    Extraocular Movements: Extraocular movements intact.     Right eye: Normal extraocular motion.     Left eye: Normal extraocular motion.     Conjunctiva/sclera: Conjunctivae normal.     Pupils: Pupils are equal, round, and reactive to light.  Neck:     Musculoskeletal: Normal range of motion and neck supple.  Cardiovascular:     Rate and Rhythm: Normal rate.  Pulmonary:     Effort: Pulmonary effort is normal.  Lymphadenopathy:     Cervical: No cervical adenopathy.  Skin:    General: Skin is warm and dry.  Neurological:     General: No focal deficit present.     Mental Status: She is alert and oriented to person, place, and time.  Psychiatric:        Mood and Affect: Mood normal.        Behavior: Behavior normal.     Results for orders placed or performed during the hospital encounter of 01/13/19 (from the past 24 hour(s))  POCT rapid strep A [facility])     Status: None   Collection Time: 01/13/19  5:14 PM  Result Value Ref Range   Streptococcus, Group A Screen (Direct) NEGATIVE NEGATIVE   Assessment and Plan :   Acute pharyngitis, unspecified etiology  Chronic tonsillar hypertrophy  We will treat empirically for strep pharyngitis given her exposure to her husband and current physical exam findings.  Strep culture pending.  Use supportive care otherwise. Counseled patient on potential for adverse effects with medications prescribed today, patient verbalized understanding. ER and return-to-clinic precautions discussed, patient verbalized understanding.    Wallis Bamberg, PA-C 01/20/19 1010

## 2019-01-16 LAB — CULTURE, GROUP A STREP (THRC)

## 2019-01-21 ENCOUNTER — Other Ambulatory Visit: Payer: Self-pay

## 2019-01-21 ENCOUNTER — Encounter: Payer: Self-pay | Admitting: Primary Care

## 2019-01-21 ENCOUNTER — Ambulatory Visit: Payer: Self-pay | Attending: Primary Care | Admitting: Primary Care

## 2019-01-21 VITALS — BP 133/87 | HR 60 | Temp 98.2°F | Resp 18 | Ht 62.0 in | Wt 226.0 lb

## 2019-01-21 DIAGNOSIS — Z6841 Body Mass Index (BMI) 40.0 and over, adult: Secondary | ICD-10-CM

## 2019-01-21 DIAGNOSIS — J359 Chronic disease of tonsils and adenoids, unspecified: Secondary | ICD-10-CM

## 2019-01-21 DIAGNOSIS — G4733 Obstructive sleep apnea (adult) (pediatric): Secondary | ICD-10-CM

## 2019-01-21 DIAGNOSIS — R030 Elevated blood-pressure reading, without diagnosis of hypertension: Secondary | ICD-10-CM

## 2019-01-21 NOTE — Progress Notes (Signed)
Acute Office Visit  Subjective:    Patient ID: Rachel Blevins, female    DOB: 04-21-1991, 28 y.o.   MRN: [id number]  Chief Complaint  Patient presents with  . Sore Throat    chronic    HPI: Patient is in today for concerns with tonsillar swelling difficulty breathing recently tx with abt's with some relief. She does have difficulty swallowing  at times and stops breathing when sleeping husband has to nudge her.  Past Medical History:  Diagnosis Date  . Anemia     Past Surgical History:  Procedure Laterality Date  . NO PAST SURGERIES      Family History  Problem Relation Age of Onset  . Hypertension Mother   . Hypertension Father   . Hypertension Maternal Grandmother     Social History   Socioeconomic History  . Marital status: Married    Spouse name: Not on file  . Number of children: Not on file  . Years of education: Not on file  . Highest education level: Not on file  Occupational History  . Not on file  Social Needs  . Financial resource strain: Not on file  . Food insecurity:    Worry: Not on file    Inability: Not on file  . Transportation needs:    Medical: Not on file    Non-medical: Not on file  Tobacco Use  . Smoking status: Never Smoker  . Smokeless tobacco: Never Used  Substance and Sexual Activity  . Alcohol use: No  . Drug use: No  . Sexual activity: Yes    Birth control/protection: I.U.D.  Lifestyle  . Physical activity:    Days per week: Not on file    Minutes per session: Not on file  . Stress: Not on file  Relationships  . Social connections:    Talks on phone: Not on file    Gets together: Not on file    Attends religious service: Not on file    Active member of club or organization: Not on file    Attends meetings of clubs or organizations: Not on file    Relationship status: Not on file  . Intimate partner violence:    Fear of current or ex partner: Not on file    Emotionally abused: Not on file    Physically abused: Not  on file    Forced sexual activity: Not on file  Other Topics Concern  . Not on file  Social History Narrative  . Not on file    Outpatient Medications Prior to Visit  Medication Sig Dispense Refill  . amoxicillin (AMOXIL) 500 MG capsule Take 1 capsule (500 mg total) by mouth 2 (two) times daily. 20 capsule 0   No facility-administered medications prior to visit.     Allergies  Allergen Reactions  . Mango Flavor [Flavoring Agent] Anaphylaxis, Itching and Swelling    Review of Systems  Constitutional: Negative.   HENT: Positive for trouble swallowing.        Swollen tonsils impeding breathing  Eyes: Negative.   Respiratory: Positive for cough and shortness of breath.        Snoring husbands has to awaken her to breath  Cardiovascular: Negative.   Gastrointestinal: Negative.   Genitourinary: Negative.   Musculoskeletal: Negative.   Skin: Negative.   Neurological: Positive for headaches.  Psychiatric/Behavioral: Negative.        Objective:    Physical Exam  Constitutional: She is oriented to person, place, and time. She  appears well-developed and well-nourished.  HENT:  Enlarge tonsils   Eyes: EOM are normal.  Neck: Normal range of motion. Neck supple.  Cardiovascular: Normal rate.  Pulmonary/Chest: Effort normal and breath sounds normal.  Abdominal: Soft. Bowel sounds are normal. She exhibits distension.  Musculoskeletal: Normal range of motion.  Neurological: She is alert and oriented to person, place, and time.  Skin: Skin is warm.  Psychiatric: She has a normal mood and affect.    BP 133/87 (BP Location: Right Arm, Patient Position: Sitting, Cuff Size: Normal)   Pulse 60   Temp 98.2 F (36.8 C) (Oral)   Resp 18   Ht  (1.575 m)   Wt 226 lb (102.5 kg)   LMP 11/22/2018   SpO2 100%   BMI 41.34 kg/m  Wt Readings from Last 3 Encounters:  01/21/19 226 lb (102.5 kg)  03/30/17 225 lb (102.1 kg)  10/03/15 237 lb (107.5 kg)    Health Maintenance Due   Topic Date Due  . TETANUS/TDAP  02/16/2011  . PAP-Cervical Cytology Screening  02/15/2013  . PAP SMEAR-Modifier  02/15/2013    There are no preventive care reminders to display for this patient.   No results found for: TSH Lab Results  Component Value Date   WBC 12.0 (H) 10/05/2015   HGB 9.9 (L) 10/05/2015   HCT 31.1 (L) 10/05/2015   MCV 82.9 10/05/2015   PLT 255 10/05/2015   Lab Results  Component Value Date   NA 138 10/05/2015   K 4.9 10/05/2015   CO2 26 10/05/2015   GLUCOSE 109 (H) 10/05/2015   BUN 14 10/05/2015   CREATININE 0.78 10/05/2015   BILITOT 0.4 10/05/2015   ALKPHOS 92 10/05/2015   AST 22 10/05/2015   ALT 24 10/05/2015   PROT 4.9 (L) 10/05/2015   ALBUMIN 2.2 (L) 10/05/2015   CALCIUM 8.1 (L) 10/05/2015   ANIONGAP 6 10/05/2015   No results found for: CHOL No results found for: HDL No results found for: LDLCALC No results found for: TRIG No results found for: CHOLHDL No results found for: ZOXW9U     Assessment & Plan:   Problem List Items Addressed This Visit    None    Visit Diagnoses    Tonsil, pharyngeal    -  Primary   Relevant Orders   Ambulatory referral to ENT   OSA (obstructive sleep apnea)       Morbid obesity with BMI of 40.0-44.9, adult (HCC)       Elevated blood pressure reading without diagnosis of hypertension         Rachel Blevins was seen today for sore throat.  Diagnoses and all orders for this visit:  Tonsil, pharyngeal This is a recurrent problem. The current episode started more than 1 year ago. The problem has been gradually worsening. The pain is worse on the right side. There has been no fever. The pain is at a severity of 9/10. The pain is severe. Associated symptoms include coughing, headaches, shortness of breath, swollen glands and trouble swallowing. She has tried gargles (salt water) for the symptoms. The treatment provided moderate relief.   Enlarge swollen tonsils esp on the right   -     Ambulatory referral to ENT   OSA (obstructive sleep apnea) Snoring must be awaken in her sleep by husband gasps for air. After ENT evaluation f/u with Dr. Delford Field  Morbid obesity with BMI of 40.0-44.9, adult [facility]) Explain some respiratory problems can cause hypoxia from weight suggested weight  loss and exercising  . Goal 10 pounds by time of ENT appt  Elevated blood pressure reading without diagnosis of hypertension Monitor closely had preeclampsia . If elevated on f/u appt will initiate Bp meds.   No orders of the defined types were placed in this encounter.    Grayce Sessions, NP

## 2019-03-07 ENCOUNTER — Telehealth: Payer: Self-pay | Admitting: Physician Assistant

## 2019-03-07 DIAGNOSIS — B3731 Acute candidiasis of vulva and vagina: Secondary | ICD-10-CM

## 2019-03-07 DIAGNOSIS — B373 Candidiasis of vulva and vagina: Secondary | ICD-10-CM

## 2019-03-07 MED ORDER — FLUCONAZOLE 150 MG PO TABS: 150.0000 mg | ORAL_TABLET | Freq: Once | ORAL | 0 refills | Status: AC

## 2019-03-07 NOTE — Progress Notes (Signed)
I have spent 5 minutes in review of e-visit questionnaire, review and updating patient chart, medical decision making and response to patient.   Manilla Strieter Cody Luvina Poirier, PA-C    

## 2019-03-07 NOTE — Progress Notes (Signed)

## 2019-05-17 ENCOUNTER — Other Ambulatory Visit: Payer: Self-pay | Admitting: *Deleted

## 2019-05-17 DIAGNOSIS — Z20822 Contact with and (suspected) exposure to covid-19: Secondary | ICD-10-CM

## 2019-05-19 LAB — NOVEL CORONAVIRUS, NAA: SARS-CoV-2, NAA: NOT DETECTED

## 2019-11-30 ENCOUNTER — Ambulatory Visit (HOSPITAL_COMMUNITY)
Admission: EM | Admit: 2019-11-30 | Discharge: 2019-11-30 | Disposition: A | Discharge status: Home / Self Care | Payer: Self-pay | Attending: Urgent Care | Admitting: Urgent Care

## 2019-11-30 ENCOUNTER — Other Ambulatory Visit: Payer: Self-pay

## 2019-11-30 ENCOUNTER — Encounter (HOSPITAL_COMMUNITY): Payer: Self-pay

## 2019-11-30 DIAGNOSIS — M79641 Pain in right hand: Secondary | ICD-10-CM

## 2019-11-30 DIAGNOSIS — Z23 Encounter for immunization: Secondary | ICD-10-CM

## 2019-11-30 DIAGNOSIS — S61411A Laceration without foreign body of right hand, initial encounter: Secondary | ICD-10-CM

## 2019-11-30 MED ORDER — TETANUS-DIPHTH-ACELL PERTUSSIS 5-2.5-18.5 LF-MCG/0.5 IM SUSP
0.5000 mL | Freq: Once | INTRAMUSCULAR | Status: AC
  Administered 2019-11-30: 18:00:00 0.5 mL via INTRAMUSCULAR

## 2019-11-30 MED ORDER — TETANUS-DIPHTH-ACELL PERTUSSIS 5-2.5-18.5 LF-MCG/0.5 IM SUSP
INTRAMUSCULAR | Status: AC
  Filled 2019-11-30: qty 0.5

## 2019-11-30 NOTE — ED Provider Notes (Signed)
  [facility]   MRN: [id number] DOB: August 30, 1991  Subjective:   Rachel Blevins is a 29 y.o. female presenting for suffering a left hand laceration from trying to wash glassware today. Cannot recall her last tdap. Cleaned her hand and came in. Denies decreased ROM, loss of sensation.   No current facility-administered medications for this encounter. No current outpatient medications on file.   Allergies  Allergen Reactions  . Mango Flavor [Flavoring Agent] Anaphylaxis, Itching and Swelling    Past Medical History:  Diagnosis Date  . Anemia      Past Surgical History:  Procedure Laterality Date  . NO PAST SURGERIES      Family History  Problem Relation Age of Onset  . Hypertension Mother   . Hypertension Father   . Hypertension Maternal Grandmother     Social History   Tobacco Use  . Smoking status: Never Smoker  . Smokeless tobacco: Never Used  Substance Use Topics  . Alcohol use: No  . Drug use: No    ROS   Objective:   Vitals: BP (!) 150/109 (BP Location: Left Arm)   Pulse 63   Temp 98.8 F (37.1 C) (Oral)   Resp 16   SpO2 99%   Physical Exam Constitutional:      General: She is not in acute distress.    Appearance: Normal appearance. She is well-developed. She is not ill-appearing, toxic-appearing or diaphoretic.  HENT:     Head: Normocephalic and atraumatic.     Nose: Nose normal.     Mouth/Throat:     Mouth: Mucous membranes are moist.     Pharynx: Oropharynx is clear.  Eyes:     General: No scleral icterus.       Right eye: No discharge.        Left eye: No discharge.     Extraocular Movements: Extraocular movements intact.     Pupils: Pupils are equal, round, and reactive to light.  Cardiovascular:     Rate and Rhythm: Normal rate.  Pulmonary:     Effort: Pulmonary effort is normal.  Musculoskeletal:       Arms:  Skin:    General: Skin is warm and dry.  Neurological:     General: No focal deficit present.     Mental  Status: She is alert and oriented to person, place, and time.  Psychiatric:        Mood and Affect: Mood normal.        Behavior: Behavior normal.        Thought Content: Thought content normal.        Judgment: Judgment normal.       PROCEDURE NOTE: laceration repair Verbal consent obtained from patient.  Local anesthesia with 6cc Lidocaine 2% with epinephrine.  Wound explored for tendon, ligament damage. Wound scrubbed with soap and water and rinsed. Wound closed with #5 4-0 Prolene (horizontal mattress, simple interrupted) sutures.  Wound cleansed and dressed.   Assessment and Plan :   1. Laceration of right hand without foreign body, initial encounter   2. Right hand pain     Laceration repaired successfully. Wound care reviewed. Return-to-clinic precautions discussed, patient verbalized understanding. Otherwise, follow up in 10 days for suture removal.     Wallis Bamberg, PA-C 11/30/19 1855

## 2019-11-30 NOTE — Discharge Instructions (Signed)

## 2019-11-30 NOTE — ED Triage Notes (Signed)
Pt present right hand laceration, she was washing dishes and cut her hand on a broken glass.

## 2019-11-30 NOTE — ED Triage Notes (Signed)
Called to see patient's laceration to right hand.  Laceration occurred while washing dishes. Cut hand on glass.   Bleeding controlled.  Laceration across hand and little finger.  Able to make a fist and bend little finger and reports sensation is normal to right little finger and hand.  Unknown last tetanus.

## 2020-11-16 DIAGNOSIS — Z01419 Encounter for gynecological examination (general) (routine) without abnormal findings: Secondary | ICD-10-CM | POA: Diagnosis not present

## 2020-11-16 DIAGNOSIS — J351 Hypertrophy of tonsils: Secondary | ICD-10-CM | POA: Diagnosis not present

## 2020-11-16 DIAGNOSIS — Z30431 Encounter for routine checking of intrauterine contraceptive device: Secondary | ICD-10-CM | POA: Diagnosis not present

## 2020-11-17 ENCOUNTER — Other Ambulatory Visit (HOSPITAL_COMMUNITY): Payer: Self-pay | Admitting: Nurse Practitioner

## 2020-11-17 ENCOUNTER — Other Ambulatory Visit: Payer: Self-pay | Admitting: Nurse Practitioner

## 2020-11-17 DIAGNOSIS — Z30431 Encounter for routine checking of intrauterine contraceptive device: Secondary | ICD-10-CM

## 2020-11-24 ENCOUNTER — Other Ambulatory Visit: Payer: Self-pay

## 2020-11-24 ENCOUNTER — Ambulatory Visit (HOSPITAL_COMMUNITY)
Admission: RE | Admit: 2020-11-24 | Discharge: 2020-11-24 | Disposition: A | Discharge status: Home / Self Care | Payer: 59 | Source: Ambulatory Visit | Attending: Nurse Practitioner | Admitting: Nurse Practitioner

## 2020-11-24 ENCOUNTER — Ambulatory Visit (HOSPITAL_COMMUNITY): Admission: RE | Admit: 2020-11-24 | Payer: 59 | Source: Ambulatory Visit

## 2020-11-24 DIAGNOSIS — Z30431 Encounter for routine checking of intrauterine contraceptive device: Secondary | ICD-10-CM | POA: Insufficient documentation

## 2020-11-24 DIAGNOSIS — N83201 Unspecified ovarian cyst, right side: Secondary | ICD-10-CM | POA: Diagnosis not present

## 2021-02-01 DIAGNOSIS — N760 Acute vaginitis: Secondary | ICD-10-CM | POA: Diagnosis not present

## 2021-02-01 DIAGNOSIS — B379 Candidiasis, unspecified: Secondary | ICD-10-CM | POA: Diagnosis not present

## 2021-02-05 ENCOUNTER — Encounter (HOSPITAL_COMMUNITY): Payer: Self-pay

## 2021-04-05 ENCOUNTER — Emergency Department (HOSPITAL_BASED_OUTPATIENT_CLINIC_OR_DEPARTMENT_OTHER)
Admission: EM | Admit: 2021-04-05 | Discharge: 2021-04-05 | Disposition: A | Discharge status: Home / Self Care | Payer: 59 | Attending: Emergency Medicine | Admitting: Emergency Medicine

## 2021-04-05 ENCOUNTER — Other Ambulatory Visit: Payer: Self-pay

## 2021-04-05 DIAGNOSIS — J029 Acute pharyngitis, unspecified: Secondary | ICD-10-CM | POA: Diagnosis not present

## 2021-04-05 LAB — GROUP A STREP BY PCR: Group A Strep by PCR: NOT DETECTED

## 2021-04-05 MED ORDER — PREDNISONE 10 MG PO TABS: 40.0000 mg | ORAL_TABLET | Freq: Every day | ORAL | 0 refills | Status: DC

## 2021-04-05 NOTE — ED Provider Notes (Signed)
[facility] EMERGENCY DEPT Provider Note   CSN: [id number] Arrival date & time: 04/05/21  5597     History Chief Complaint  Patient presents with   Sore Throat    Rachel Blevins is a 30 y.o. female.  Patient with complaint of sore throat since Thursday.  No fevers.  Patient has done home COVID test x2 and has been negative.  Patient has a history she says of enlarged tonsils.  Patient's been taking DayQuil for the pain.  Denies any really flu or upper respiratory symptoms other than the sore throat.      Past Medical History:  Diagnosis Date   Anemia     Patient Active Problem List   Diagnosis Date Noted   Pre-eclampsia 10/03/2015   Pre-eclampsia in third trimester 10/01/2015    Past Surgical History:  Procedure Laterality Date   NO PAST SURGERIES       OB History     Gravida  1   Para  1   Term  0   Preterm  1   AB  0   Living  1      SAB  0   IAB  0   Ectopic  0   Multiple      Live Births  1           Family History  Problem Relation Age of Onset   Hypertension Mother    Hypertension Father    Hypertension Maternal Grandmother     Social History   Tobacco Use   Smoking status: Never   Smokeless tobacco: Never  Vaping Use   Vaping Use: Never used  Substance Use Topics   Alcohol use: No   Drug use: No    Home Medications Prior to Admission medications   Not on File    Allergies    Mango flavor [flavoring agent]  Review of Systems   Review of Systems  Constitutional:  Negative for chills and fever.  HENT:  Positive for sore throat. Negative for ear pain.   Eyes:  Negative for pain and visual disturbance.  Respiratory:  Negative for cough and shortness of breath.   Cardiovascular:  Negative for chest pain and palpitations.  Gastrointestinal:  Negative for abdominal pain and vomiting.  Genitourinary:  Negative for dysuria and hematuria.  Musculoskeletal:  Negative for arthralgias and back pain.  Skin:   Negative for color change and rash.  Neurological:  Negative for seizures and syncope.  All other systems reviewed and are negative.  Physical Exam Updated Vital Signs BP (!) 162/114 (BP Location: Right Arm)   Pulse 79   Temp 98.1 F (36.7 C)   Resp 18   Ht 1.6 m (5\' 3" )   Wt 90.7 kg   SpO2 99%   BMI 35.43 kg/m   Physical Exam Vitals and nursing note reviewed.  Constitutional:      General: She is not in acute distress.    Appearance: She is well-developed. She is not ill-appearing or toxic-appearing.  HENT:     Head: Normocephalic and atraumatic.     Mouth/Throat:     Mouth: Mucous membranes are moist. No oral lesions.     Pharynx: Uvula midline. Pharyngeal swelling and posterior oropharyngeal erythema present. No oropharyngeal exudate or uvula swelling.     Tonsils: No tonsillar exudate or tonsillar abscesses. 3+ on the right. 3+ on the left.     Comments: The erythema on the tonsils is slight. Eyes:  Conjunctiva/sclera: Conjunctivae normal.     Pupils: Pupils are equal, round, and reactive to light.  Neck:     Thyroid: No thyromegaly.  Cardiovascular:     Rate and Rhythm: Normal rate and regular rhythm.     Heart sounds: No murmur heard. Pulmonary:     Effort: Pulmonary effort is normal. No respiratory distress.     Breath sounds: Normal breath sounds.  Abdominal:     Palpations: Abdomen is soft.     Tenderness: There is no abdominal tenderness.  Musculoskeletal:     Cervical back: Neck supple.  Lymphadenopathy:     Cervical: No cervical adenopathy.  Skin:    General: Skin is warm and dry.  Neurological:     General: No focal deficit present.     Mental Status: She is alert and oriented to person, place, and time.    ED Results / Procedures / Treatments   Labs (all labs ordered are listed, but only abnormal results are displayed) Labs Reviewed  GROUP A STREP BY PCR    EKG None  Radiology No results found.  Procedures Procedures    Medications Ordered in ED Medications - No data to display  ED Course  I have reviewed the triage vital signs and the nursing notes.  Pertinent labs & imaging results that were available during my care of the patient were reviewed by me and considered in my medical decision making (see chart for details).    MDM Rules/Calculators/A&P                          Patient with negative home COVID testing.  We will do rapid strep.  If negative most likely viral.  And will treat with a course of prednisone to help with the pain.  And the swelling.  If positive will treat with antibiotics.  Although patient is 30 years old mono nucleus is I guess is a possibility still.  Made patient aware that no specific treatment required other than symptomatic treatment for that.  Patient nontoxic no acute distress.  No evidence of any peritonsillar abscess uvula is midline tonsils are enlarged but no exudate just slight erythema on the tonsils.  Strep test negative.  We will treat with a course of prednisone.  Give patient ENT referral.   Final Clinical Impression(s) / ED Diagnoses Final diagnoses:  Pharyngitis, unspecified etiology    Rx / DC Orders ED Discharge Orders     None        Vanetta Mulders, MD 04/05/21 [phone]

## 2021-04-05 NOTE — ED Triage Notes (Signed)
Pt to ED from home with c/o sore throat for the past 2 days. Pt states she has large tonsils and they have been swollen in the past but never this large.

## 2021-04-05 NOTE — Discharge Instructions (Addendum)
Take the prednisone as directed.  Make an appointment to follow-up with ear nose and throat.  Strep test was negative.  Return for any new or worse symptoms

## 2021-06-21 ENCOUNTER — Ambulatory Visit: Payer: 59 | Admitting: Family Medicine

## 2021-06-28 ENCOUNTER — Ambulatory Visit: Payer: 59 | Admitting: Emergency Medicine

## 2021-06-28 ENCOUNTER — Encounter: Payer: Self-pay | Admitting: Emergency Medicine

## 2021-06-28 ENCOUNTER — Other Ambulatory Visit: Payer: Self-pay

## 2021-06-28 VITALS — BP 132/78 | HR 71 | Temp 98.3°F | Ht 63.0 in | Wt 206.0 lb

## 2021-06-28 DIAGNOSIS — Z7689 Persons encountering health services in other specified circumstances: Secondary | ICD-10-CM

## 2021-06-28 DIAGNOSIS — J351 Hypertrophy of tonsils: Secondary | ICD-10-CM | POA: Insufficient documentation

## 2021-06-28 DIAGNOSIS — R400 Somnolence: Secondary | ICD-10-CM | POA: Insufficient documentation

## 2021-06-28 DIAGNOSIS — R5383 Other fatigue: Secondary | ICD-10-CM | POA: Diagnosis not present

## 2021-06-28 DIAGNOSIS — I10 Essential (primary) hypertension: Secondary | ICD-10-CM | POA: Diagnosis not present

## 2021-06-28 DIAGNOSIS — R29818 Other symptoms and signs involving the nervous system: Secondary | ICD-10-CM | POA: Insufficient documentation

## 2021-06-28 LAB — HEMOGLOBIN A1C: Hgb A1c MFr Bld: 5.6 % (ref 4.6–6.5)

## 2021-06-28 LAB — LIPID PANEL
Cholesterol: 180 mg/dL (ref 0–200)
HDL: 37.5 mg/dL — ABNORMAL LOW (ref >39.00)
NonHDL: 142.18
Total CHOL/HDL Ratio: 5
Triglycerides: 283 mg/dL — ABNORMAL HIGH (ref 0.0–149.0)
VLDL: 56.6 mg/dL — ABNORMAL HIGH (ref 0.0–40.0)

## 2021-06-28 LAB — COMPREHENSIVE METABOLIC PANEL
ALT: 32 U/L (ref 0–35)
AST: 18 U/L (ref 0–37)
Albumin: 4.2 g/dL (ref 3.5–5.2)
Alkaline Phosphatase: 81 U/L (ref 39–117)
BUN: 9 mg/dL (ref 6–23)
CO2: 28 mEq/L (ref 19–32)
Calcium: 9.3 mg/dL (ref 8.4–10.5)
Chloride: 101 mEq/L (ref 96–112)
Creatinine, Ser: 0.73 mg/dL (ref 0.40–1.20)
GFR: 111.18 mL/min (ref >60.00)
Glucose, Bld: 90 mg/dL (ref 70–99)
Potassium: 4.6 mEq/L (ref 3.5–5.1)
Sodium: 136 mEq/L (ref 135–145)
Total Bilirubin: 0.3 mg/dL (ref 0.2–1.2)
Total Protein: 7.3 g/dL (ref 6.0–8.3)

## 2021-06-28 LAB — CBC WITH DIFFERENTIAL/PLATELET
Basophils Absolute: 0.1 10*3/uL (ref 0.0–0.1)
Basophils Relative: 0.9 % (ref 0.0–3.0)
Eosinophils Absolute: 0.2 10*3/uL (ref 0.0–0.7)
Eosinophils Relative: 2.8 % (ref 0.0–5.0)
HCT: 39.3 % (ref 36.0–46.0)
Hemoglobin: 13.1 g/dL (ref 12.0–15.0)
Lymphocytes Relative: 33.7 % (ref 12.0–46.0)
Lymphs Abs: 2.4 10*3/uL (ref 0.7–4.0)
MCHC: 33.3 g/dL (ref 30.0–36.0)
MCV: 86.4 fl (ref 78.0–100.0)
Monocytes Absolute: 0.5 10*3/uL (ref 0.1–1.0)
Monocytes Relative: 6.5 % (ref 3.0–12.0)
Neutro Abs: 4 10*3/uL (ref 1.4–7.7)
Neutrophils Relative %: 56.1 % (ref 43.0–77.0)
Platelets: 377 10*3/uL (ref 150.0–400.0)
RBC: 4.55 Mil/uL (ref 3.87–5.11)
RDW: 12.9 % (ref 11.5–15.5)
WBC: 7.1 10*3/uL (ref 4.0–10.5)

## 2021-06-28 LAB — VITAMIN B12: Vitamin B-12: 324 pg/mL (ref 211–911)

## 2021-06-28 LAB — TSH: TSH: 3.35 u[IU]/mL (ref 0.35–5.50)

## 2021-06-28 LAB — LDL CHOLESTEROL, DIRECT: Direct LDL: 111 mg/dL

## 2021-06-28 NOTE — Patient Instructions (Signed)
Sleep Apnea Sleep apnea affects breathing during sleep. It causes breathing to stop for 10 seconds or more, or to become shallow. People with sleep apnea usually snoreloudly. It can also increase the risk of: Heart attack. Stroke. Being very overweight (obese). Diabetes. Heart failure. Irregular heartbeat. High blood pressure. The goal of treatment is to help you breathe normally again. What are the causes?  The most common cause of this condition is a collapsed or blocked airway. There are three kinds of sleep apnea: Obstructive sleep apnea. This is caused by a blocked or collapsed airway. Central sleep apnea. This happens when the brain does not send the right signals to the muscles that control breathing. Mixed sleep apnea. This is a combination of obstructive and central sleep apnea. What increases the risk? Being overweight. Smoking. Having a small airway. Being older. Being female. Drinking alcohol. Taking medicines to calm yourself (sedatives or tranquilizers). Having family members with the condition. Having a tongue or tonsils that are larger than normal. What are the signs or symptoms? Trouble staying asleep. Loud snoring. Headaches in the morning. Waking up gasping. Dry mouth or sore throat in the morning. Being sleepy or tired during the day. If you are sleepy or tired during the day, you may also: Not be able to focus your mind (concentrate). Forget things. Get angry a lot and have mood swings. Feel sad (depressed). Have changes in your personality. Have less interest in sex, if you are female. Be unable to have an erection, if you are female. How is this treated?  Sleeping on your side. Using a medicine to get rid of mucus in your nose (decongestant). Avoiding the use of alcohol, medicines to help you relax, or certain pain medicines (narcotics). Losing weight, if needed. Changing your diet. Quitting smoking. Using a machine to open your airway while you  sleep, such as: An oral appliance. This is a mouthpiece that shifts your lower jaw forward. A CPAP device. This device blows air through a mask when you breathe out (exhale). An EPAP device. This has valves that you put in each nostril. A BPAP device. This device blows air through a mask when you breathe in (inhale) and breathe out. Having surgery if other treatments do not work. Follow these instructions at home: Lifestyle Make changes that your doctor recommends. Eat a healthy diet. Lose weight if needed. Avoid alcohol, medicines to help you relax, and some pain medicines. Do not smoke or use any products that contain nicotine or tobacco. If you need help quitting, ask your doctor. General instructions Take over-the-counter and prescription medicines only as told by your doctor. If you were given a machine to use while you sleep, use it only as told by your doctor. If you are having surgery, make sure to tell your doctor you have sleep apnea. You may need to bring your device with you. Keep all follow-up visits. Contact a doctor if: The machine that you were given to use during sleep bothers you or does not seem to be working. You do not get better. You get worse. Get help right away if: Your chest hurts. You have trouble breathing in enough air. You have an uncomfortable feeling in your back, arms, or stomach. You have trouble talking. One side of your body feels weak. A part of your face is hanging down. These symptoms may be an emergency. Get help right away. Call your local emergency services (911 in the [place] Do not wait to see if the symptoms   will go away. Do not drive yourself to the hospital. Summary This condition affects breathing during sleep. The most common cause is a collapsed or blocked airway. The goal of treatment is to help you breathe normally while you sleep. This information is not intended to replace advice given to you by your health care provider. Make  sure you discuss any questions you have with your healthcare provider. Document Revised: 08/28/2020 Document Reviewed: 08/28/2020 Elsevier Patient Education  2022 Elsevier Inc.  

## 2021-06-28 NOTE — Assessment & Plan Note (Addendum)
Normotensive at present time.  Elevated readings during recent emergency room evaluations for laceration and tonsillitis episodes. Advised to monitor blood pressure readings at home several times a day for the next several weeks and keep a log. Dietary approaches to stop hypertension discussed. No recent blood work.  We will do blood work today.

## 2021-06-28 NOTE — Progress Notes (Signed)
Rachel Blevins 30 y.o.   Chief Complaint  Patient presents with   New Patient (Initial Visit)    Pt would like to discuss hypertension and sleep apnea    HISTORY OF PRESENT ILLNESS: This is a 30 y.o. female first visit to this office, here to establish care with me. Thinks she may have sleep apnea.  Snores.  Husband has witnessed apnea events during her sleep. Patient wakes up tired.  Has daytime somnolence.  Feels tired all the time.  Frequent headaches. Blood pressure has been at times elevated.  No history of hypertension.  No medications. Has history of enlarged tonsils. No other complaints or medical concerns today. BP Readings from Last 3 Encounters:  06/28/21 132/78  04/05/21 (!) 162/114  11/30/19 (!) 150/109     HPI   Prior to Admission medications   Medication Sig Start Date End Date Taking? Authorizing Provider  predniSONE (DELTASONE) 10 MG tablet Take 4 tablets (40 mg total) by mouth daily. 04/05/21   Vanetta Mulders, MD    Allergies  Allergen Reactions   Mango Flavor [Flavoring Agent] Anaphylaxis, Itching and Swelling    There are no problems to display for this patient.   Past Medical History:  Diagnosis Date   Anemia     Past Surgical History:  Procedure Laterality Date   NO PAST SURGERIES      Social History   Socioeconomic History   Marital status: Single    Spouse name: Not on file   Number of children: Not on file   Years of education: Not on file   Highest education level: Not on file  Occupational History   Not on file  Tobacco Use   Smoking status: Never   Smokeless tobacco: Never  Vaping Use   Vaping Use: Never used  Substance and Sexual Activity   Alcohol use: No   Drug use: No   Sexual activity: Yes    Birth control/protection: I.U.D.  Other Topics Concern   Not on file  Social History Narrative   ** Merged History Encounter **       Social Determinants of Health   Financial Resource Strain: Not on file  Food  Insecurity: Not on file  Transportation Needs: Not on file  Physical Activity: Not on file  Stress: Not on file  Social Connections: Not on file  Intimate Partner Violence: Not on file    Family History  Problem Relation Age of Onset   Hypertension Mother    Hypertension Father    Hypertension Maternal Grandmother      Review of Systems  Constitutional: Negative.  Negative for chills and fever.  HENT: Negative.  Negative for congestion and sore throat.   Eyes: Negative.   Respiratory: Negative.  Negative for cough and shortness of breath.   Cardiovascular: Negative.  Negative for chest pain and palpitations.  Gastrointestinal: Negative.  Negative for abdominal pain, nausea and vomiting.  Genitourinary: Negative.   Musculoskeletal:  Negative for joint pain.  Skin: Negative.  Negative for rash.  Neurological:  Positive for headaches. Negative for dizziness.  All other systems reviewed and are negative.  Vitals:   06/28/21 1442  BP: 132/78  Pulse: 71  Temp: 98.3 F (36.8 C)  SpO2: 98%    Physical Exam Vitals reviewed.  Constitutional:      Appearance: Normal appearance.  HENT:     Head: Normocephalic.     Mouth/Throat:     Mouth: Mucous membranes are moist.  Pharynx: Oropharynx is clear.     Comments: Enlarged tonsils bilaterally Eyes:     Extraocular Movements: Extraocular movements intact.     Conjunctiva/sclera: Conjunctivae normal.     Pupils: Pupils are equal, round, and reactive to light.  Cardiovascular:     Rate and Rhythm: Normal rate and regular rhythm.     Pulses: Normal pulses.     Heart sounds: Normal heart sounds.  Pulmonary:     Effort: Pulmonary effort is normal.     Breath sounds: Normal breath sounds.  Musculoskeletal:        General: Normal range of motion.     Cervical back: Normal range of motion and neck supple. No tenderness.     Right lower leg: No edema.     Left lower leg: No edema.  Lymphadenopathy:     Cervical: No cervical  adenopathy.  Skin:    General: Skin is warm and dry.     Capillary Refill: Capillary refill takes less than 2 seconds.  Neurological:     General: No focal deficit present.     Mental Status: She is alert and oriented to person, place, and time.  Psychiatric:        Mood and Affect: Mood normal.        Behavior: Behavior normal.     ASSESSMENT & PLAN: Reactive hypertension Normotensive at present time.  Elevated readings during recent emergency room evaluations for laceration and tonsillitis episodes. Advised to monitor blood pressure readings at home several times a day for the next several weeks and keep a log. Dietary approaches to stop hypertension discussed. No recent blood work.  We will do blood work today.  Suspected sleep apnea Multiple symptoms suggestive of sleep apnea. Enlarged tonsils contributing to sleep apnea. Needs sleep apnea studies and evaluation by sleep apnea doctors. Referral placed today.  Rachel Blevins was seen today for new patient (initial visit).  Diagnoses and all orders for this visit:  Tiredness -     CBC with Differential/Platelet -     Comprehensive metabolic panel -     TSH -     Hemoglobin A1c -     Lipid panel -     Vitamin B12  Suspected sleep apnea -     Ambulatory referral to Neurology  Daytime sleepiness  Reactive hypertension  Encounter to establish care  Enlarged tonsils  Patient Instructions  Sleep Apnea Sleep apnea affects breathing during sleep. It causes breathing to stop for 10 seconds or more, or to become shallow. People with sleep apnea usually snore loudly. It can also increase the risk of: Heart attack. Stroke. Being very overweight (obese). Diabetes. Heart failure. Irregular heartbeat. High blood pressure. The goal of treatment is to help you breathe normally again. What are the causes? The most common cause of this condition is a collapsed or blocked airway. There are three kinds of sleep  apnea: Obstructive sleep apnea. This is caused by a blocked or collapsed airway. Central sleep apnea. This happens when the brain does not send the right signals to the muscles that control breathing. Mixed sleep apnea. This is a combination of obstructive and central sleep apnea. What increases the risk? Being overweight. Smoking. Having a small airway. Being older. Being female. Drinking alcohol. Taking medicines to calm yourself (sedatives or tranquilizers). Having family members with the condition. Having a tongue or tonsils that are larger than normal. What are the signs or symptoms? Trouble staying asleep. Loud snoring. Headaches in the morning.  Waking up gasping. Dry mouth or sore throat in the morning. Being sleepy or tired during the day. If you are sleepy or tired during the day, you may also: Not be able to focus your mind (concentrate). Forget things. Get angry a lot and have mood swings. Feel sad (depressed). Have changes in your personality. Have less interest in sex, if you are female. Be unable to have an erection, if you are female. How is this treated?  Sleeping on your side. Using a medicine to get rid of mucus in your nose (decongestant). Avoiding the use of alcohol, medicines to help you relax, or certain pain medicines (narcotics). Losing weight, if needed. Changing your diet. Quitting smoking. Using a machine to open your airway while you sleep, such as: An oral appliance. This is a mouthpiece that shifts your lower jaw forward. A CPAP device. This device blows air through a mask when you breathe out (exhale). An EPAP device. This has valves that you put in each nostril. A BPAP device. This device blows air through a mask when you breathe in (inhale) and breathe out. Having surgery if other treatments do not work. Follow these instructions at home: Lifestyle Make changes that your doctor recommends. Eat a healthy diet. Lose weight if needed. Avoid  alcohol, medicines to help you relax, and some pain medicines. Do not smoke or use any products that contain nicotine or tobacco. If you need help quitting, ask your doctor. General instructions Take over-the-counter and prescription medicines only as told by your doctor. If you were given a machine to use while you sleep, use it only as told by your doctor. If you are having surgery, make sure to tell your doctor you have sleep apnea. You may need to bring your device with you. Keep all follow-up visits. Contact a doctor if: The machine that you were given to use during sleep bothers you or does not seem to be working. You do not get better. You get worse. Get help right away if: Your chest hurts. You have trouble breathing in enough air. You have an uncomfortable feeling in your back, arms, or stomach. You have trouble talking. One side of your body feels weak. A part of your face is hanging down. These symptoms may be an emergency. Get help right away. Call your local emergency services (911 in the [place] Do not wait to see if the symptoms will go away. Do not drive yourself to the hospital. Summary This condition affects breathing during sleep. The most common cause is a collapsed or blocked airway. The goal of treatment is to help you breathe normally while you sleep. This information is not intended to replace advice given to you by your health care provider. Make sure you discuss any questions you have with your health care provider. Document Revised: 08/28/2020 Document Reviewed: 08/28/2020 Elsevier Patient Education  2022 Elsevier Inc.    Edwina Barth, MD [facility]

## 2021-06-28 NOTE — Assessment & Plan Note (Signed)
Multiple symptoms suggestive of sleep apnea. Enlarged tonsils contributing to sleep apnea. Needs sleep apnea studies and evaluation by sleep apnea doctors. Referral placed today.

## 2021-06-29 ENCOUNTER — Ambulatory Visit: Payer: 59 | Admitting: Emergency Medicine

## 2021-06-30 ENCOUNTER — Encounter: Payer: Self-pay | Admitting: Emergency Medicine

## 2021-06-30 ENCOUNTER — Other Ambulatory Visit: Payer: Self-pay | Admitting: Emergency Medicine

## 2021-06-30 DIAGNOSIS — J351 Hypertrophy of tonsils: Secondary | ICD-10-CM

## 2021-07-03 ENCOUNTER — Telehealth: Payer: 59 | Admitting: Family

## 2021-07-03 DIAGNOSIS — R399 Unspecified symptoms and signs involving the genitourinary system: Secondary | ICD-10-CM

## 2021-07-03 MED ORDER — CEPHALEXIN 500 MG PO CAPS: 500.0000 mg | ORAL_CAPSULE | Freq: Two times a day (BID) | ORAL | 0 refills | Status: DC

## 2021-07-03 NOTE — Progress Notes (Signed)

## 2021-09-01 DIAGNOSIS — J3503 Chronic tonsillitis and adenoiditis: Secondary | ICD-10-CM | POA: Diagnosis not present

## 2021-09-01 DIAGNOSIS — J353 Hypertrophy of tonsils with hypertrophy of adenoids: Secondary | ICD-10-CM | POA: Diagnosis not present

## 2021-09-13 ENCOUNTER — Institutional Professional Consult (permissible substitution): Payer: 59 | Admitting: Neurology

## 2021-09-21 ENCOUNTER — Other Ambulatory Visit: Payer: Self-pay | Admitting: Otolaryngology

## 2021-10-05 ENCOUNTER — Ambulatory Visit: Payer: 59 | Admitting: Emergency Medicine

## 2021-10-13 ENCOUNTER — Other Ambulatory Visit: Payer: Self-pay

## 2021-10-13 ENCOUNTER — Encounter (HOSPITAL_BASED_OUTPATIENT_CLINIC_OR_DEPARTMENT_OTHER): Payer: Self-pay | Admitting: Otolaryngology

## 2021-10-24 NOTE — Anesthesia Preprocedure Evaluation (Addendum)
Anesthesia Evaluation  Patient identified by MRN, date of birth, ID band Patient awake    Reviewed: Allergy & Precautions, H&P , NPO status , Patient's Chart, lab work & pertinent test results  History of Anesthesia Complications (+) PONV and Family history of anesthesia reaction  Airway Mallampati: I  TM Distance: >3 FB Neck ROM: Full    Dental no notable dental hx. (+) Teeth Intact, Dental Advisory Given   Pulmonary neg pulmonary ROS,    Pulmonary exam normal breath sounds clear to auscultation       Cardiovascular Exercise Tolerance: Good hypertension, negative cardio ROS Normal cardiovascular exam Rhythm:Regular Rate:Normal     Neuro/Psych negative neurological ROS  negative psych ROS   GI/Hepatic negative GI ROS, Neg liver ROS,   Endo/Other  negative endocrine ROSMorbid obesity  Renal/GU negative Renal ROS  negative genitourinary   Musculoskeletal negative musculoskeletal ROS (+)   Abdominal   Peds negative pediatric ROS (+)  Hematology negative hematology ROS (+) Blood dyscrasia, anemia ,   Anesthesia Other Findings   Reproductive/Obstetrics negative OB ROS                            Anesthesia Physical Anesthesia Plan  ASA: 2  Anesthesia Plan: General   Post-op Pain Management: Ofirmev IV (intra-op) and Toradol IV (intra-op)   Induction: Intravenous  PONV Risk Score and Plan: 3 and Ondansetron, Dexamethasone, Midazolam, TIVA and Scopolamine patch - Pre-op  Airway Management Planned: Oral ETT  Additional Equipment: None  Intra-op Plan:   Post-operative Plan: Extubation in OR  Informed Consent: I have reviewed the patients History and Physical, chart, labs and discussed the procedure including the risks, benefits and alternatives for the proposed anesthesia with the patient or authorized representative who has indicated his/her understanding and acceptance.        Plan Discussed with: Anesthesiologist, CRNA and Surgeon  Anesthesia Plan Comments: (  )       Anesthesia Quick Evaluation

## 2021-10-25 ENCOUNTER — Ambulatory Visit (HOSPITAL_BASED_OUTPATIENT_CLINIC_OR_DEPARTMENT_OTHER): Payer: 59 | Admitting: Certified Registered"

## 2021-10-25 ENCOUNTER — Ambulatory Visit (HOSPITAL_BASED_OUTPATIENT_CLINIC_OR_DEPARTMENT_OTHER)
Admission: RE | Admit: 2021-10-25 | Discharge: 2021-10-25 | Disposition: A | Discharge status: Home / Self Care | Payer: 59 | Attending: Otolaryngology | Admitting: Otolaryngology

## 2021-10-25 ENCOUNTER — Encounter (HOSPITAL_BASED_OUTPATIENT_CLINIC_OR_DEPARTMENT_OTHER)
Admission: RE | Disposition: A | Discharge status: Home / Self Care | Payer: Self-pay | Source: Home / Self Care | Attending: Otolaryngology

## 2021-10-25 ENCOUNTER — Encounter (HOSPITAL_BASED_OUTPATIENT_CLINIC_OR_DEPARTMENT_OTHER): Payer: Self-pay | Admitting: Otolaryngology

## 2021-10-25 ENCOUNTER — Other Ambulatory Visit: Payer: Self-pay

## 2021-10-25 DIAGNOSIS — R0683 Snoring: Secondary | ICD-10-CM | POA: Diagnosis not present

## 2021-10-25 DIAGNOSIS — J3503 Chronic tonsillitis and adenoiditis: Secondary | ICD-10-CM | POA: Diagnosis not present

## 2021-10-25 DIAGNOSIS — J3501 Chronic tonsillitis: Secondary | ICD-10-CM | POA: Diagnosis not present

## 2021-10-25 DIAGNOSIS — I10 Essential (primary) hypertension: Secondary | ICD-10-CM | POA: Insufficient documentation

## 2021-10-25 DIAGNOSIS — J353 Hypertrophy of tonsils with hypertrophy of adenoids: Secondary | ICD-10-CM | POA: Insufficient documentation

## 2021-10-25 DIAGNOSIS — Z6835 Body mass index (BMI) 35.0-35.9, adult: Secondary | ICD-10-CM | POA: Diagnosis not present

## 2021-10-25 DIAGNOSIS — J029 Acute pharyngitis, unspecified: Secondary | ICD-10-CM | POA: Diagnosis not present

## 2021-10-25 HISTORY — PX: TONSILLECTOMY AND ADENOIDECTOMY: SHX28

## 2021-10-25 HISTORY — DX: Family history of other specified conditions: Z84.89

## 2021-10-25 LAB — POCT PREGNANCY, URINE: Preg Test, Ur: NEGATIVE

## 2021-10-25 SURGERY — TONSILLECTOMY AND ADENOIDECTOMY
Anesthesia: General

## 2021-10-25 MED ORDER — AMISULPRIDE (ANTIEMETIC) 5 MG/2ML IV SOLN
INTRAVENOUS | Status: AC
  Filled 2021-10-25: qty 2

## 2021-10-25 MED ORDER — FENTANYL CITRATE (PF) 100 MCG/2ML IJ SOLN
INTRAMUSCULAR | Status: DC | PRN
  Administered 2021-10-25 (×2): 25 ug via INTRAVENOUS
  Administered 2021-10-25: 50 ug via INTRAVENOUS

## 2021-10-25 MED ORDER — OXYCODONE HCL 5 MG/5ML PO SOLN
ORAL | Status: AC
  Filled 2021-10-25: qty 5

## 2021-10-25 MED ORDER — LIDOCAINE 2% (20 MG/ML) 5 ML SYRINGE
INTRAMUSCULAR | Status: AC
  Filled 2021-10-25: qty 15

## 2021-10-25 MED ORDER — DEXAMETHASONE SODIUM PHOSPHATE 10 MG/ML IJ SOLN
INTRAMUSCULAR | Status: AC
  Filled 2021-10-25: qty 1

## 2021-10-25 MED ORDER — PROPOFOL 500 MG/50ML IV EMUL
INTRAVENOUS | Status: AC
  Filled 2021-10-25: qty 50

## 2021-10-25 MED ORDER — FENTANYL CITRATE (PF) 100 MCG/2ML IJ SOLN
INTRAMUSCULAR | Status: AC
  Filled 2021-10-25: qty 2

## 2021-10-25 MED ORDER — ACETAMINOPHEN 160 MG/5ML PO SOLN: 325.0000 mg | ORAL | Status: DC | PRN

## 2021-10-25 MED ORDER — ROCURONIUM BROMIDE 100 MG/10ML IV SOLN
INTRAVENOUS | Status: DC | PRN
  Administered 2021-10-25: 50 mg via INTRAVENOUS

## 2021-10-25 MED ORDER — MEPERIDINE HCL 25 MG/ML IJ SOLN: 6.2500 mg | INTRAMUSCULAR | Status: DC | PRN

## 2021-10-25 MED ORDER — AZITHROMYCIN 200 MG/5ML PO SUSR: 500.0000 mg | Freq: Every day | ORAL | 0 refills | Status: AC

## 2021-10-25 MED ORDER — FENTANYL CITRATE (PF) 100 MCG/2ML IJ SOLN
25.0000 ug | INTRAMUSCULAR | Status: DC | PRN
  Administered 2021-10-25: 50 ug via INTRAVENOUS

## 2021-10-25 MED ORDER — DEXMEDETOMIDINE (PRECEDEX) IN NS 20 MCG/5ML (4 MCG/ML) IV SYRINGE
PREFILLED_SYRINGE | INTRAVENOUS | Status: AC
  Filled 2021-10-25: qty 5

## 2021-10-25 MED ORDER — PROPOFOL 500 MG/50ML IV EMUL
INTRAVENOUS | Status: AC
  Filled 2021-10-25: qty 200

## 2021-10-25 MED ORDER — SCOPOLAMINE 1 MG/3DAYS TD PT72
1.0000 | MEDICATED_PATCH | TRANSDERMAL | Status: DC
  Administered 2021-10-25: 1.5 mg via TRANSDERMAL

## 2021-10-25 MED ORDER — SCOPOLAMINE 1 MG/3DAYS TD PT72
MEDICATED_PATCH | TRANSDERMAL | Status: AC
  Filled 2021-10-25: qty 1

## 2021-10-25 MED ORDER — DEXAMETHASONE SODIUM PHOSPHATE 4 MG/ML IJ SOLN
INTRAMUSCULAR | Status: DC | PRN
  Administered 2021-10-25: 10 mg via INTRAVENOUS

## 2021-10-25 MED ORDER — ONDANSETRON HCL 4 MG/2ML IJ SOLN
INTRAMUSCULAR | Status: AC
  Filled 2021-10-25: qty 8

## 2021-10-25 MED ORDER — OXYCODONE HCL 5 MG PO TABS: 5.0000 mg | ORAL_TABLET | Freq: Once | ORAL | Status: AC | PRN

## 2021-10-25 MED ORDER — LIDOCAINE HCL (CARDIAC) PF 100 MG/5ML IV SOSY
PREFILLED_SYRINGE | INTRAVENOUS | Status: DC | PRN
  Administered 2021-10-25: 60 mg via INTRAVENOUS

## 2021-10-25 MED ORDER — SUGAMMADEX SODIUM 200 MG/2ML IV SOLN
INTRAVENOUS | Status: DC | PRN
Start: 2021-10-25 — End: 2021-10-25
  Administered 2021-10-25: 200 mg via INTRAVENOUS

## 2021-10-25 MED ORDER — OXYMETAZOLINE HCL 0.05 % NA SOLN
NASAL | Status: DC | PRN
  Administered 2021-10-25: 1 via TOPICAL

## 2021-10-25 MED ORDER — MIDAZOLAM HCL 2 MG/2ML IJ SOLN
INTRAMUSCULAR | Status: AC
  Filled 2021-10-25: qty 2

## 2021-10-25 MED ORDER — PROPOFOL 500 MG/50ML IV EMUL
INTRAVENOUS | Status: DC | PRN
  Administered 2021-10-25: 150 ug/kg/min via INTRAVENOUS

## 2021-10-25 MED ORDER — SODIUM CHLORIDE 0.9 % IR SOLN
Status: DC | PRN
  Administered 2021-10-25: 1

## 2021-10-25 MED ORDER — ACETAMINOPHEN 325 MG PO TABS: 325.0000 mg | ORAL_TABLET | ORAL | Status: DC | PRN

## 2021-10-25 MED ORDER — AMISULPRIDE (ANTIEMETIC) 5 MG/2ML IV SOLN
10.0000 mg | Freq: Once | INTRAVENOUS | Status: AC
  Administered 2021-10-25: 10 mg via INTRAVENOUS

## 2021-10-25 MED ORDER — PROPOFOL 10 MG/ML IV BOLUS
INTRAVENOUS | Status: DC | PRN
Start: 2021-10-25 — End: 2021-10-25
  Administered 2021-10-25: 200 mg via INTRAVENOUS

## 2021-10-25 MED ORDER — OXYCODONE-ACETAMINOPHEN 5-325 MG PO TABS
1.0000 | ORAL_TABLET | ORAL | 0 refills | Status: AC | PRN
Start: 2021-10-25 — End: 2021-10-30

## 2021-10-25 MED ORDER — ONDANSETRON HCL 4 MG/2ML IJ SOLN
INTRAMUSCULAR | Status: DC | PRN
  Administered 2021-10-25: 4 mg via INTRAVENOUS

## 2021-10-25 MED ORDER — LACTATED RINGERS IV SOLN: INTRAVENOUS | Status: DC

## 2021-10-25 MED ORDER — MIDAZOLAM HCL 5 MG/5ML IJ SOLN
INTRAMUSCULAR | Status: DC | PRN
  Administered 2021-10-25: 2 mg via INTRAVENOUS

## 2021-10-25 MED ORDER — ONDANSETRON HCL 4 MG/2ML IJ SOLN: 4.0000 mg | Freq: Once | INTRAMUSCULAR | Status: DC | PRN

## 2021-10-25 MED ORDER — OXYCODONE HCL 5 MG/5ML PO SOLN
5.0000 mg | Freq: Once | ORAL | Status: AC | PRN
  Administered 2021-10-25: 5 mg via ORAL

## 2021-10-25 MED ORDER — ROCURONIUM BROMIDE 10 MG/ML (PF) SYRINGE
PREFILLED_SYRINGE | INTRAVENOUS | Status: AC
  Filled 2021-10-25: qty 10

## 2021-10-25 SURGICAL SUPPLY — 23 items
CANISTER SUCT 1200ML W/VALVE (MISCELLANEOUS) ×2 IMPLANT
COAGULATOR SUCT SWTCH 10FR 6 (ELECTROSURGICAL) ×1 IMPLANT
COVER BACK TABLE 60X90IN (DRAPES) ×2 IMPLANT
COVER MAYO STAND STRL (DRAPES) ×2 IMPLANT
DEFOGGER MIRROR 1QT (MISCELLANEOUS) ×2 IMPLANT
ELECT REM PT RETURN 9FT ADLT (ELECTROSURGICAL)
ELECT REM PT RETURN 9FT PED (ELECTROSURGICAL) ×2
ELECTRODE REM PT RETRN 9FT PED (ELECTROSURGICAL) IMPLANT
ELECTRODE REM PT RTRN 9FT ADLT (ELECTROSURGICAL) IMPLANT
GAUZE SPONGE 4X4 12PLY STRL LF (GAUZE/BANDAGES/DRESSINGS) ×2 IMPLANT
GLOVE SURG POLYISO LF SZ7.5 (GLOVE) ×1 IMPLANT
GOWN STRL REUS W/ TWL LRG LVL3 (GOWN DISPOSABLE) ×2 IMPLANT
GOWN STRL REUS W/TWL LRG LVL3 (GOWN DISPOSABLE) ×4
IV NS 500ML (IV SOLUTION) ×2
IV NS 500ML BAXH (IV SOLUTION) ×1 IMPLANT
NS IRRIG 1000ML POUR BTL (IV SOLUTION) ×2 IMPLANT
SHEET MEDIUM DRAPE 40X70 STRL (DRAPES) ×2 IMPLANT
SPONGE TONSIL 1.25 RF SGL STRG (GAUZE/BANDAGES/DRESSINGS) ×2 IMPLANT
SYR BULB EAR ULCER 3OZ GRN STR (SYRINGE) ×1 IMPLANT
TOWEL GREEN STERILE FF (TOWEL DISPOSABLE) ×2 IMPLANT
TUBE CONNECTING 20X1/4 (TUBING) ×2 IMPLANT
TUBE SALEM SUMP 16 FR W/ARV (TUBING) ×1 IMPLANT
WAND COBLATOR 70 EVAC XTRA (SURGICAL WAND) ×2 IMPLANT

## 2021-10-25 NOTE — H&P (Signed)
Cc: Enlarged Tonsils, recurrent tonsillitis  HPI: The patient is a 31 y/o female who presents today for evaluation of recurrent tonsillitis. The patient has noted issues with her tonsils for the past 6 years. She notes a sore throat that occurs almost every month which also affects her ears and neck. The patient has frequent tonsils stones with associated halitosis. She is also concerned with her loud snoring and possible sleep apnea. The patient is otherwise healthy. Previous ENT surgery is denied.   The patient's review of systems (constitutional, eyes, ENT, cardiovascular, respiratory, GI, musculoskeletal, skin, neurologic, psychiatric, endocrine, hematologic, allergic) is noted in the ROS questionnaire.  It is reviewed with the patient.   Major events: None.  Ongoing medical problems: Hypertension, headaches.  Family health history: Diabetes .  Social history: The patient is married. She denies the use of tobacco or illegal drugs. She drinks alcohol on a social basis.   Exam: General: Communicates without difficulty, well nourished, no acute distress. Head:  Normocephalic, no lesions or asymmetry. Eyes: PERRL, EOMI. No scleral icterus, conjunctivae clear.  Neuro: CN II exam reveals vision grossly intact.  No nystagmus at any point of gaze. Ears:  EAC normal without erythema AU.  TM intact without fluid and mobile AU. Nose: Moist, pink mucosa without lesions or mass. Mouth: Oral cavity clear and moist, no lesions, tonsils symmetric. Tonsils are 4+. Mild erythema and exudate noted. Neck: Full range of motion, no lymphadenopathy or masses.   Assessment  1. The patient's history and physical exam findings are consistent with chronic tonsillitis/pharyngitis secondary to adenotonsillar hypertrophy.  Plan  1. The treatment options include continuing conservative observation versus adenotonsillectomy.  Based on the patient's history and physical exam findings, the patient will likely benefit from  having the tonsils and adenoid removed.  The risks, benefits, alternatives, and details of the procedure are reviewed with the patient.  Questions are invited and answered.  2. The patient is interested in proceeding with the procedure.  We will schedule the procedure in accordance with the family schedule.

## 2021-10-25 NOTE — Anesthesia Postprocedure Evaluation (Signed)
Anesthesia Post Note  Patient: Rachel Blevins  Procedure(s) Performed: TONSILLECTOMY AND ADENOIDECTOMY     Patient location during evaluation: PACU Anesthesia Type: General Level of consciousness: awake and alert Pain management: pain level controlled Vital Signs Assessment: post-procedure vital signs reviewed and stable Respiratory status: spontaneous breathing, nonlabored ventilation, respiratory function stable and patient connected to nasal cannula oxygen Cardiovascular status: blood pressure returned to baseline and stable Postop Assessment: no apparent nausea or vomiting Anesthetic complications: no   No notable events documented.  Last Vitals:  Vitals:   10/25/21 0915 10/25/21 0953  BP: (!) 132/91 (!) 151/94  Pulse: (!) 52 (!) 53  Resp: 15 16  Temp:  36.7 C  SpO2: 95% 99%    Last Pain:  Vitals:   10/25/21 1014  TempSrc:   PainSc: 4                  Omarian Jaquith

## 2021-10-25 NOTE — Discharge Instructions (Addendum)
Oxycodone given at 9:50am today 10/25/2021  Post Anesthesia Home Care Instructions  Activity: Get plenty of rest for the remainder of the day. A responsible individual must stay with you for 24 hours following the procedure.  For the next 24 hours, DO NOT: -Drive a car -Advertising copywriter -Drink alcoholic beverages -Take any medication unless instructed by your physician -Make any legal decisions or sign important papers.  Meals: Start with liquid foods such as gelatin or soup. Progress to regular foods as tolerated. Avoid greasy, spicy, heavy foods. If nausea and/or vomiting occur, drink only clear liquids until the nausea and/or vomiting subsides. Call your physician if vomiting continues.  Special Instructions/Symptoms: Your throat may feel dry or sore from the anesthesia or the breathing tube placed in your throat during surgery. If this causes discomfort, gargle with warm salt water. The discomfort should disappear within 24 hours.  You had a scopolamine patch placed behind your LEFT ear for the management of post- operative nausea and/or vomiting:  1. The medication in the patch is effective for 72 hours, after which it should be removed.  Wrap patch in a tissue and discard in the trash. Wash hands thoroughly with soap and water. 2. You may remove the patch earlier than 72 hours if you experience unpleasant side effects which may include dry mouth, dizziness or visual disturbances. 3. Avoid touching the patch. Wash your hands with soap and water after contact with the patch.      [facility] Postoperative Instructions for Tonsillectomy & Adenoidectomy (T&A) Activity Restrict activity at home for the first two days, resting as much as possible. Light indoor activity is best. You may usually return to school or work within a week but void strenuous activity and sports for two weeks. Sleep with your head elevated on 2-3 pillows for 3-4 days to help decrease  swelling. Diet Due to tissue swelling and throat discomfort, you may have little desire to drink for several days. However fluids are very important to prevent dehydration. You will find that non-acidic juices, soups, popsicles, Jell-O, custard, puddings, and any soft or mashed foods taken in small quantities can be swallowed fairly easily. Try to increase your fluid and food intake as the discomfort subsides. It is recommended that a child receive 1-1/2 quarts of fluid in a 24-hour period. Adult require twice this amount.  Discomfort Your sore throat may be relieved by applying an ice collar to your neck and/or by taking Tylenol. You may experience an earache, which is due to referred pain from the throat. Referred ear pain is commonly felt at night when trying to rest.  Bleeding                        Although rare, there is risk of having some bleeding during the first 2 weeks after having a T&A. This usually happens between days 7-10 postoperatively. If you or your child should have any bleeding, try to remain calm. We recommend sitting up quietly in a chair and gently spitting out the blood into a bowl. For adults, gargling gently with ice water may help. If the bleeding does not stop after a short time (5 minutes), is more than 1 teaspoonful, or if you become worried, please call our office at [phone] or go directly to the nearest hospital emergency room. Do not eat or drink anything prior to going to the hospital as you may need to be taken to the  operating room in order to control the bleeding. GENERAL CONSIDERATIONS Brush your teeth regularly. Avoid mouthwashes and gargles for three weeks. You may gargle gently with warm salt-water as necessary or spray with Chloraseptic. You may make salt-water by placing 2 teaspoons of table salt into a quart of fresh water. Warm the salt-water in a microwave to a luke warm temperature.  Avoid exposure to colds and upper respiratory infections if  possible.  If you look into a mirror or into your child's mouth, you will see white-gray patches in the back of the throat. This is normal after having a T&A and is like a scab that forms on the skin after an abrasion. It will disappear once the back of the throat heals completely. However, it may cause a noticeable odor; this too will disappear with time. Again, warm salt-water gargles may be used to help keep the throat clean and promote healing.  You may notice a temporary change in voice quality, such as a higher pitched voice or a nasal sound, until healing is complete. This may last for 1-2 weeks and should resolve.  Do not take or give you child any medications that we have not prescribed or recommended.  Snoring may occur, especially at night, for the first week after a T&A. It is due to swelling of the soft palate and will usually resolve.  Please call our office at [phone] if you have any questions.

## 2021-10-25 NOTE — Anesthesia Procedure Notes (Signed)
Procedure Name: Intubation Date/Time: 10/25/2021 7:25 AM Performed by: Signe Colt, CRNA Pre-anesthesia Checklist: Patient identified, Emergency Drugs available, Suction available and Patient being monitored Patient Re-evaluated:Patient Re-evaluated prior to induction Oxygen Delivery Method: Circle system utilized Preoxygenation: Pre-oxygenation with 100% oxygen Induction Type: IV induction Ventilation: Mask ventilation without difficulty Laryngoscope Size: Mac and 3 Grade View: Grade I Tube type: Oral Tube size: 7.0 mm Number of attempts: 1 Airway Equipment and Method: Stylet and Oral airway Placement Confirmation: ETT inserted through vocal cords under direct vision, positive ETCO2 and breath sounds checked- equal and bilateral Secured at: 21 cm Tube secured with: Tape Dental Injury: Teeth and Oropharynx as per pre-operative assessment

## 2021-10-25 NOTE — Op Note (Signed)
DATE OF PROCEDURE:  10/25/2021                              OPERATIVE REPORT  SURGEON:  Newman Pies, MD  PREOPERATIVE DIAGNOSES: 1. Adenotonsillar hypertrophy. 2. Chronic tonsillitis and pharyngitis  POSTOPERATIVE DIAGNOSES: 1. Adenotonsillar hypertrophy. 2. Chronic tonsillitis and pharyngitis  PROCEDURE PERFORMED:  Adenotonsillectomy.  ANESTHESIA:  General endotracheal tube anesthesia.  COMPLICATIONS:  None.  ESTIMATED BLOOD LOSS:  Minimal.  INDICATION FOR PROCEDURE:  Rachel Blevins is a 31 y.o. female with a history of chronic tonsillitis/pharyngitis and halitosis.  According to the patient, she has been experiencing chronic throat discomfort with halitosis for several years. The patient continued to be symptomatic despite medical treatments. On examination, the patient was noted to have bilateral cryptic tonsils, with numerous tonsilloliths. Based on the above findings, the decision was made for the patient to undergo the adenotonsillectomy procedure. Likelihood of success in reducing symptoms was also discussed.  The risks, benefits, alternatives, and details of the procedure were discussed with the patient.  Questions were invited and answered.  Informed consent was obtained.  DESCRIPTION:  The patient was taken to the operating room and placed supine on the operating table.  General endotracheal tube anesthesia was administered by the anesthesiologist.  The patient was positioned and prepped and draped in a standard fashion for adenotonsillectomy.  A Crowe-Davis mouth gag was inserted into the oral cavity for exposure. 4+ cryptic tonsils were noted bilaterally.  No bifidity was noted.  Indirect mirror examination of the nasopharynx revealed moderate adenoid hypertrophy. The adenoid was ablated with the Coblator device. Hemostasis was achieved with the Coblator device.  The right tonsil was then grasped with a straight Allis clamp and retracted medially.  It was resected free from the  underlying pharyngeal constrictor muscles with the Coblator device.  The same procedure was repeated on the left side without exception.  The surgical sites were copiously irrigated.  The mouth gag was removed.  The care of the patient was turned over to the anesthesiologist.  The patient was awakened from anesthesia without difficulty.  The patient was extubated and transferred to the recovery room in good condition.  OPERATIVE FINDINGS:  Adenotonsillar hypertrophy.  SPECIMEN:  None  FOLLOWUP CARE:  The patient will be discharged home once awake and alert.  She will be placed on amoxicillin 800 mg p.o. b.i.d. for 5 days, and percocet for postop pain control.   The patient will follow up in my office in approximately 2 weeks.  Cylee Dattilo W Ariel Wingrove 10/25/2021 8:11 AM

## 2021-10-25 NOTE — Transfer of Care (Signed)
Immediate Anesthesia Transfer of Care Note  Patient: Asli Tokarski  Procedure(s) Performed: TONSILLECTOMY AND ADENOIDECTOMY  Patient Location: PACU  Anesthesia Type:General  Level of Consciousness: awake, alert , oriented and patient cooperative  Airway & Oxygen Therapy: Patient Spontanous Breathing and Patient connected to face mask oxygen  Post-op Assessment: Report given to RN and Post -op Vital signs reviewed and stable  Post vital signs: Reviewed and stable  Last Vitals:  Vitals Value Taken Time  BP    Temp    Pulse 96 10/25/21 0819  Resp 17 10/25/21 0820  SpO2 100 % 10/25/21 0819  Vitals shown include unvalidated device data.  Last Pain:  Vitals:   10/25/21 0630  TempSrc: Oral  PainSc: 0-No pain         Complications: No notable events documented.

## 2021-10-26 ENCOUNTER — Encounter (HOSPITAL_BASED_OUTPATIENT_CLINIC_OR_DEPARTMENT_OTHER): Payer: Self-pay | Admitting: Otolaryngology

## 2021-11-04 ENCOUNTER — Emergency Department (HOSPITAL_COMMUNITY): Payer: 59

## 2021-11-04 ENCOUNTER — Emergency Department (HOSPITAL_COMMUNITY)
Admission: EM | Admit: 2021-11-04 | Discharge: 2021-11-04 | Disposition: A | Discharge status: Home / Self Care | Payer: 59 | Attending: Emergency Medicine | Admitting: Emergency Medicine

## 2021-11-04 ENCOUNTER — Encounter (HOSPITAL_COMMUNITY): Payer: Self-pay

## 2021-11-04 DIAGNOSIS — R102 Pelvic and perineal pain: Secondary | ICD-10-CM

## 2021-11-04 DIAGNOSIS — Z91040 Latex allergy status: Secondary | ICD-10-CM | POA: Diagnosis not present

## 2021-11-04 DIAGNOSIS — N83291 Other ovarian cyst, right side: Secondary | ICD-10-CM | POA: Diagnosis not present

## 2021-11-04 DIAGNOSIS — K6289 Other specified diseases of anus and rectum: Secondary | ICD-10-CM | POA: Insufficient documentation

## 2021-11-04 DIAGNOSIS — N83201 Unspecified ovarian cyst, right side: Secondary | ICD-10-CM

## 2021-11-04 DIAGNOSIS — R9431 Abnormal electrocardiogram [ECG] [EKG]: Secondary | ICD-10-CM | POA: Diagnosis not present

## 2021-11-04 DIAGNOSIS — R109 Unspecified abdominal pain: Secondary | ICD-10-CM | POA: Diagnosis not present

## 2021-11-04 DIAGNOSIS — N83202 Unspecified ovarian cyst, left side: Secondary | ICD-10-CM | POA: Diagnosis not present

## 2021-11-04 LAB — CBC WITH DIFFERENTIAL/PLATELET
Abs Immature Granulocytes: 0.03 10*3/uL (ref 0.00–0.07)
Basophils Absolute: 0.1 10*3/uL (ref 0.0–0.1)
Basophils Relative: 1 %
Eosinophils Absolute: 0.2 10*3/uL (ref 0.0–0.5)
Eosinophils Relative: 2 %
HCT: 39.2 % (ref 36.0–46.0)
Hemoglobin: 13 g/dL (ref 12.0–15.0)
Immature Granulocytes: 0 %
Lymphocytes Relative: 21 %
Lymphs Abs: 2.1 10*3/uL (ref 0.7–4.0)
MCH: 29.4 pg (ref 26.0–34.0)
MCHC: 33.2 g/dL (ref 30.0–36.0)
MCV: 88.7 fL (ref 80.0–100.0)
Monocytes Absolute: 0.6 10*3/uL (ref 0.1–1.0)
Monocytes Relative: 6 %
Neutro Abs: 7.2 10*3/uL (ref 1.7–7.7)
Neutrophils Relative %: 70 %
Platelets: 384 10*3/uL (ref 150–400)
RBC: 4.42 MIL/uL (ref 3.87–5.11)
RDW: 12.2 % (ref 11.5–15.5)
WBC: 10.3 10*3/uL (ref 4.0–10.5)
nRBC: 0 % (ref 0.0–0.2)

## 2021-11-04 LAB — COMPREHENSIVE METABOLIC PANEL
ALT: 28 U/L (ref 0–44)
AST: 13 U/L — ABNORMAL LOW (ref 15–41)
Albumin: 3.9 g/dL (ref 3.5–5.0)
Alkaline Phosphatase: 75 U/L (ref 38–126)
Anion gap: 8 (ref 5–15)
BUN: 11 mg/dL (ref 6–20)
CO2: 27 mmol/L (ref 22–32)
Calcium: 9.3 mg/dL (ref 8.9–10.3)
Chloride: 103 mmol/L (ref 98–111)
Creatinine, Ser: 0.8 mg/dL (ref 0.44–1.00)
GFR, Estimated: >60 mL/min (ref >60)
Glucose, Bld: 79 mg/dL (ref 70–99)
Potassium: 3.8 mmol/L (ref 3.5–5.1)
Sodium: 138 mmol/L (ref 135–145)
Total Bilirubin: 0.3 mg/dL (ref 0.3–1.2)
Total Protein: 7.1 g/dL (ref 6.5–8.1)

## 2021-11-04 LAB — URINALYSIS, ROUTINE W REFLEX MICROSCOPIC
Bilirubin Urine: NEGATIVE
Glucose, UA: NEGATIVE mg/dL
Hgb urine dipstick: NEGATIVE
Ketones, ur: NEGATIVE mg/dL
Leukocytes,Ua: NEGATIVE
Nitrite: NEGATIVE
Protein, ur: NEGATIVE mg/dL
Specific Gravity, Urine: 1.02 (ref 1.005–1.030)
pH: 7.5 (ref 5.0–8.0)

## 2021-11-04 LAB — WET PREP, GENITAL
Clue Cells Wet Prep HPF POC: NONE SEEN
Sperm: NONE SEEN
Trich, Wet Prep: NONE SEEN
WBC, Wet Prep HPF POC: >=10 — AB (ref <10)
Yeast Wet Prep HPF POC: NONE SEEN

## 2021-11-04 LAB — I-STAT BETA HCG BLOOD, ED (MC, WL, AP ONLY): I-stat hCG, quantitative: <5 m[IU]/mL (ref <5)

## 2021-11-04 LAB — LIPASE, BLOOD: Lipase: 38 U/L (ref 11–51)

## 2021-11-04 MED ORDER — DOXYCYCLINE HYCLATE 100 MG PO CAPS
100.0000 mg | ORAL_CAPSULE | Freq: Two times a day (BID) | ORAL | 0 refills | Status: DC
  Filled 2021-11-12: qty 20, 10d supply, fill #0

## 2021-11-04 MED ORDER — METRONIDAZOLE 500 MG PO TABS
500.0000 mg | ORAL_TABLET | Freq: Two times a day (BID) | ORAL | 0 refills | Status: DC
  Filled 2021-11-12: qty 14, 7d supply, fill #0

## 2021-11-04 MED ORDER — SODIUM CHLORIDE 0.9 % IV SOLN
1.0000 g | Freq: Once | INTRAVENOUS | Status: AC
  Administered 2021-11-04: 1 g via INTRAVENOUS
  Filled 2021-11-04: qty 10

## 2021-11-04 MED ORDER — IOHEXOL 300 MG/ML  SOLN
100.0000 mL | Freq: Once | INTRAMUSCULAR | Status: AC | PRN
  Administered 2021-11-04: 100 mL via INTRAVENOUS

## 2021-11-04 MED ORDER — ONDANSETRON 4 MG PO TBDP
4.0000 mg | ORAL_TABLET | Freq: Three times a day (TID) | ORAL | 0 refills | Status: DC | PRN
  Filled 2021-11-12: qty 20, 7d supply, fill #0

## 2021-11-04 NOTE — Discharge Instructions (Addendum)
It was a pleasure taking care of you here in the emergency department today.  Your CT scan and ultrasound showed a cyst.  Your IUD is in the correct location.    Given your pain on exam and some vaginal discharge we have started you on antibiotics to ensure this is not the beginnings of a pelvic infection.    Take the antibiotics as prescribed, follow-up with primary care provider or OB/GYN, return for new worsening symptoms

## 2021-11-04 NOTE — ED Notes (Signed)
Patient transported to Ultrasound 

## 2021-11-04 NOTE — ED Provider Notes (Signed)
[facility] EMERGENCY DEPARTMENT Provider Note   CSN: [id number] Arrival date & time: 11/04/21  1457     History  Chief Complaint  Patient presents with   Emesis    Rachel Blevins is a 31 y.o. female 1 week post tonsillectomy by Dr.Teoh here for evaluation of emesis and abd pain. Had a BM and felt sudden onset diffuse lower abd pain with some rectal pain. Pain improved however still has some aching. No focal RLQ, LLQ pain. Hx of IUD placed, [place] 1 year ago for lost string and according to patient. Doe snot get menstrual cycles since IUD placed. Some nausea without emesis. No recent illnesses. No vag discharge, abnormal bleeding. No fever, rectal bleeding, rectal/ GU swelling. Does not want anything for pain at this time.  With regards to her recent tonsillectomy. Doing well, tolerating PO intake. No bleeding, neck swelling  HPI     Home Medications Prior to Admission medications   Medication Sig Start Date End Date Taking? Authorizing Provider  doxycycline (VIBRAMYCIN) 100 MG capsule Take 1 capsule (100 mg total) by mouth 2 (two) times daily. 11/04/21  Yes Andelyn Spade A, PA-C  ibuprofen (ADVIL) 200 MG tablet Take 200 mg by mouth every 6 (six) hours as needed for moderate pain.   Yes [provider]  metroNIDAZOLE (FLAGYL) 500 MG tablet Take 1 tablet (500 mg total) by mouth 2 (two) times daily. 11/04/21  Yes Merric Yost A, PA-C  ondansetron (ZOFRAN-ODT) 4 MG disintegrating tablet Take 1 tablet (4 mg total) by mouth every 8 (eight) hours as needed for nausea or vomiting. 11/04/21  Yes Aseret Hoffman A, PA-C      Allergies    Mango flavor [flavoring agent] and Latex    Review of Systems   Review of Systems  Constitutional: Negative.   HENT: Negative.    Respiratory: Negative.    Cardiovascular: Negative.   Gastrointestinal:  Positive for abdominal pain and nausea. Negative for abdominal distention, anal bleeding, blood in stool, constipation,  diarrhea and vomiting.  Genitourinary:  Positive for pelvic pain. Negative for decreased urine volume, difficulty urinating, dysuria, frequency, hematuria, menstrual problem, urgency, vaginal bleeding, vaginal discharge and vaginal pain.  Musculoskeletal: Negative.   Skin: Negative.   Neurological: Negative.   All other systems reviewed and are negative.  Physical Exam Updated Vital Signs BP 122/83    Pulse 73    Temp 98.4 F (36.9 C) (Oral)    Resp 18    Ht 5\' 4"  (1.626 m)    Wt 95 kg    SpO2 99%    BMI 35.95 kg/m  Physical Exam Vitals and nursing note reviewed. Exam conducted with a chaperone present.  Constitutional:      General: She is not in acute distress.    Appearance: Normal appearance. She is well-developed. She is not ill-appearing.  HENT:     Head: Atraumatic.     Jaw: There is normal jaw occlusion.  Eyes:     Pupils: Pupils are equal, round, and reactive to light.  Cardiovascular:     Rate and Rhythm: Normal rate.     Pulses: Normal pulses.     Heart sounds: Normal heart sounds.  Pulmonary:     Effort: Pulmonary effort is normal. No respiratory distress.     Breath sounds: Normal breath sounds and air entry.  Abdominal:     General: There is no distension.     Palpations: Abdomen is soft.     Tenderness:  There is abdominal tenderness in the right lower quadrant, suprapubic area and left lower quadrant. There is no right CVA tenderness, left CVA tenderness, guarding or rebound. Negative signs include McBurney's sign.     Hernia: No hernia is present.  Genitourinary:    Comments: Normal appearing external female genitalia without rashes or lesions, normal vaginal epithelium. Normal appearing cervix with scant white/ light yellow discharge. No cervical petechiae. Cervical os is closed. There is no bleeding noted at the os. No odor. Bimanual: Mild CMT tenderness.  No palpable adnexal masses or tenderness. Uterus midline and not fixed. Rectovaginal exam was deferred.  No  cystocele or rectocele noted. No pelvic lymphadenopathy noted. Wet prep was obtained.  Cultures for gonorrhea and chlamydia collected. Exam performed with chaperone in room.   Musculoskeletal:        General: Normal range of motion.     Cervical back: Full passive range of motion without pain and normal range of motion.  Skin:    General: Skin is warm and dry.     Capillary Refill: Capillary refill takes less than 2 seconds.  Neurological:     General: No focal deficit present.     Mental Status: She is alert.  Psychiatric:        Mood and Affect: Mood normal.    ED Results / Procedures / Treatments   Labs (all labs ordered are listed, but only abnormal results are displayed) Labs Reviewed  WET PREP, GENITAL - Abnormal; Notable for the following components:      Result Value   WBC, Wet Prep HPF POC >=10 (*)    All other components within normal limits  COMPREHENSIVE METABOLIC PANEL - Abnormal; Notable for the following components:   AST 13 (*)    All other components within normal limits  CBC WITH DIFFERENTIAL/PLATELET  URINALYSIS, ROUTINE W REFLEX MICROSCOPIC  LIPASE, BLOOD  I-STAT BETA HCG BLOOD, ED ([facility], [facility], [facility] ONLY)  GC/CHLAMYDIA PROBE AMP ([place]) NOT AT [facility]    EKG None  Radiology CT Abdomen Pelvis W Contrast  Result Date: 11/04/2021 CLINICAL DATA:  Left lower quadrant abdominal pain. EXAM: CT ABDOMEN AND PELVIS WITH CONTRAST TECHNIQUE: Multidetector CT imaging of the abdomen and pelvis was performed using the standard protocol following bolus administration of intravenous contrast. RADIATION DOSE REDUCTION: This exam was performed according to the departmental dose-optimization program which includes automated exposure control, adjustment of the mA and/or kV according to patient size and/or use of iterative reconstruction technique. CONTRAST:  153mL OMNIPAQUE IOHEXOL 300 MG/ML  SOLN COMPARISON:  11/04/2021. FINDINGS: Lower chest: Atelectasis is present at the lung  bases. Hepatobiliary: There is focal fatty infiltration of the liver adjacent to the falciform ligament. No gallstones, gallbladder wall thickening, or biliary dilatation. Pancreas: Unremarkable. No pancreatic ductal dilatation or surrounding inflammatory changes. Spleen: Normal in size without focal abnormality. Adrenals/Urinary Tract: Adrenal glands are unremarkable. Kidneys are normal, without renal calculi, focal lesion, or hydronephrosis. Bladder is unremarkable. Stomach/Bowel: Stomach is within normal limits. Appendix appears normal. No evidence of bowel wall thickening, distention, or inflammatory changes. No free air or pneumatosis. Vascular/Lymphatic: No significant vascular findings are present. No enlarged abdominal or pelvic lymph nodes. Reproductive: An IUD is present in the uterus. There are cystic structure is present in the adnexal regions bilaterally measuring 3.2 cm on the right and 2.2 cm on the left. Other: No abdominal wall hernia or abnormality. No abdominopelvic ascites. Musculoskeletal: No acute or significant osseous findings. IMPRESSION: 1. No acute intra-abdominal  process. 2. Cystic structures in the adnexal regions bilaterally, better evaluated on recent ultrasound. Electronically Signed   By: Brett Fairy M.D.   On: 11/04/2021 21:44   US PELVIC COMPLETE W TRANSVAGINAL AND TORSION R/O  Result Date: 11/04/2021 CLINICAL DATA:  Pelvic pain. The patient has had an IUD for the past 5 years. EXAM: TRANSABDOMINAL AND TRANSVAGINAL ULTRASOUND OF PELVIS DOPPLER ULTRASOUND OF OVARIES TECHNIQUE: Both transabdominal and transvaginal ultrasound examinations of the pelvis were performed. Transabdominal technique was performed for global imaging of the pelvis including uterus, ovaries, adnexal regions, and pelvic cul-de-sac. It was necessary to proceed with endovaginal exam following the transabdominal exam to visualize the uterus and ovaries in better detail. Color and duplex Doppler ultrasound was  utilized to evaluate blood flow to the ovaries. COMPARISON:  11/24/2020. FINDINGS: Uterus Measurements: 9.7 x 5.1 x 4.2 cm = volume: 108 mL. No fibroids or other mass visualized. Endometrium Thickness: 6.8 mm. Stable irregular echogenic focus with dense posterior acoustical shadowing in the endometrial canal in the lower uterine segment Right ovary Measurements: 4.7 x 3.7 x 3.7 cm = volume: 34 mL. Interval 3.5 x 2.9 x 2.7 cm rounded, circumscribed mass with echogenic and hypoechoic components in the right ovary. Prominent surrounding blood flow without internal blood flow with color Doppler. Left ovary Measurements: 3.9 x 3.4 x 1.9 cm = volume: 13 mL. Normal appearance/no adnexal mass. Pulsed Doppler evaluation of both ovaries demonstrates normal low-resistance arterial and venous waveforms. Other findings No abnormal free fluid. IMPRESSION: 1. 3.5 cm right ovarian probable corpus luteum. A hemorrhagic cyst or ovarian endometrioma are less likely possibilities. A follow-up pelvic ultrasound is recommended in 3 months to ensure resolution. 2. Stable position of the intrauterine device in the endometrial canal in the lower uterine segment. Electronically Signed   By: Claudie Revering M.D.   On: 11/04/2021 17:54    Procedures Procedures    Medications Ordered in ED Medications  cefTRIAXone (ROCEPHIN) 1 g in sodium chloride 0.9 % 100 mL IVPB (1 g Intravenous New Bag/Given 11/04/21 2237)  iohexol (OMNIPAQUE) 300 MG/ML solution 100 mL (100 mLs Intravenous Contrast Given 11/04/21 2136)   ED Course/ Medical Decision Making/ A&P   31 year old here for evaluation of pelvic pain earlier today.  She appears otherwise well.  Has not needed pain medicine here in the emergency department.  GU exam does have some mild discharge and cervical motion tenderness, no adnexal tenderness.  Did admit to some pain with internal ultrasound earlier.  We will treat for early PID.  Her abdomen is soft, nontender.  Ultrasound shows  right-sided cyst.  No torsion.  CT scan shows similar.  IUD in correct place without perforation.  With regards to recent tonsillectomy area appears well-healing.  No active bleeding.  She is tolerating p.o. intake, no voice changes. Low suspicion for complication from post tonsillectomy.  Labs and imaging personally reviewed and interpreted:  Wet prep with WBCs UA negative for infection Pregnancy test negative Lipase 38 CBC without leukocytosis Metabolic panel without significant abnormality  Patient reassessed.  Discussed her labs and imaging.  Will treat for possible PID.  Patient is nontoxic, nonseptic appearing, in no apparent distress.  Patient's pain and other symptoms adequately managed in emergency department.  Fluid bolus given.  Labs, imaging and vitals reviewed.  Patient does not meet the SIRS or Sepsis criteria.  On repeat exam patient does not have a surgical abdomin and there are no peritoneal signs.  No indication of appendicitis, bowel obstruction,  bowel perforation, cholecystitis, diverticulitis, PID, intermittent/ persistent torsion, TOA or ectopic pregnancy.  Patient discharged home with symptomatic treatment and given strict instructions for follow-up with their primary care physician.  I have also discussed reasons to return immediately to the ER.  Patient expresses understanding and agrees with plan.                            Medical Decision Making Amount and/or Complexity of Data Reviewed External Data Reviewed: labs, radiology and notes.    Details: recent tonsillectomy Labs: ordered. Decision-making details documented in ED Course. Radiology: ordered and independent interpretation performed. Decision-making details documented in ED Course. ECG/medicine tests: ordered.  Risk OTC drugs. Prescription drug management. Parenteral controlled substances. Risk Details: Not feel patient needs additional labs, imaging, hospitalization at this time         Final  Clinical Impression(s) / ED Diagnoses Final diagnoses:  Pelvic pain  Cyst of right ovary    Rx / DC Orders ED Discharge Orders          Ordered    metroNIDAZOLE (FLAGYL) 500 MG tablet  2 times daily        11/04/21 2203    doxycycline (VIBRAMYCIN) 100 MG capsule  2 times daily        11/04/21 2203    ondansetron (ZOFRAN-ODT) 4 MG disintegrating tablet  Every 8 hours PRN        11/04/21 2203              Abayomi Pattison A, PA-C 11/04/21 2305    Horton, Alvin Critchley, DO 11/04/21 2320

## 2021-11-04 NOTE — ED Triage Notes (Signed)
Post tonsillectomy one week, pt at work began vomiting and pain in abdomen, sharp pain in lower abdomen sudden onset, pt 6/10 pain

## 2021-11-05 LAB — GC/CHLAMYDIA PROBE AMP (~~LOC~~) NOT AT ARMC
Chlamydia: NEGATIVE
Comment: NEGATIVE
Comment: NORMAL
Neisseria Gonorrhea: NEGATIVE

## 2021-11-12 ENCOUNTER — Other Ambulatory Visit (HOSPITAL_COMMUNITY): Payer: Self-pay

## 2021-11-22 ENCOUNTER — Ambulatory Visit: Payer: 59 | Admitting: Emergency Medicine

## 2021-11-23 ENCOUNTER — Institutional Professional Consult (permissible substitution): Payer: 59 | Admitting: Neurology

## 2021-12-13 ENCOUNTER — Ambulatory Visit: Payer: 59 | Admitting: Emergency Medicine

## 2021-12-29 ENCOUNTER — Encounter: Payer: 59 | Admitting: Emergency Medicine

## 2022-01-03 ENCOUNTER — Encounter: Payer: 59 | Admitting: Emergency Medicine

## 2022-05-24 DIAGNOSIS — Z113 Encounter for screening for infections with a predominantly sexual mode of transmission: Secondary | ICD-10-CM | POA: Diagnosis not present

## 2022-05-24 DIAGNOSIS — Z124 Encounter for screening for malignant neoplasm of cervix: Secondary | ICD-10-CM | POA: Diagnosis not present

## 2022-05-24 DIAGNOSIS — Z309 Encounter for contraceptive management, unspecified: Secondary | ICD-10-CM | POA: Diagnosis not present

## 2022-05-24 DIAGNOSIS — Z1151 Encounter for screening for human papillomavirus (HPV): Secondary | ICD-10-CM | POA: Diagnosis not present

## 2022-05-24 DIAGNOSIS — Z01419 Encounter for gynecological examination (general) (routine) without abnormal findings: Secondary | ICD-10-CM | POA: Diagnosis not present

## 2022-05-24 DIAGNOSIS — Z6837 Body mass index (BMI) 37.0-37.9, adult: Secondary | ICD-10-CM | POA: Diagnosis not present

## 2022-05-24 DIAGNOSIS — Z8759 Personal history of other complications of pregnancy, childbirth and the puerperium: Secondary | ICD-10-CM | POA: Diagnosis not present

## 2022-06-07 DIAGNOSIS — Z30432 Encounter for removal of intrauterine contraceptive device: Secondary | ICD-10-CM | POA: Diagnosis not present

## 2022-08-02 ENCOUNTER — Encounter: Payer: Self-pay | Admitting: Emergency Medicine

## 2022-08-02 ENCOUNTER — Ambulatory Visit (INDEPENDENT_AMBULATORY_CARE_PROVIDER_SITE_OTHER): Payer: 59 | Admitting: Emergency Medicine

## 2022-08-02 VITALS — BP 128/76 | HR 65 | Temp 98.9°F | Ht 64.0 in | Wt 228.5 lb

## 2022-08-02 DIAGNOSIS — Z13228 Encounter for screening for other metabolic disorders: Secondary | ICD-10-CM

## 2022-08-02 DIAGNOSIS — Z1159 Encounter for screening for other viral diseases: Secondary | ICD-10-CM

## 2022-08-02 DIAGNOSIS — Z0000 Encounter for general adult medical examination without abnormal findings: Secondary | ICD-10-CM | POA: Diagnosis not present

## 2022-08-02 DIAGNOSIS — Z3491 Encounter for supervision of normal pregnancy, unspecified, first trimester: Secondary | ICD-10-CM

## 2022-08-02 DIAGNOSIS — Z1329 Encounter for screening for other suspected endocrine disorder: Secondary | ICD-10-CM | POA: Diagnosis not present

## 2022-08-02 DIAGNOSIS — Z13220 Encounter for screening for lipoid disorders: Secondary | ICD-10-CM

## 2022-08-02 DIAGNOSIS — Z130 Encounter for screening for diseases of the blood and blood-forming organs and certain disorders involving the immune mechanism: Secondary | ICD-10-CM | POA: Diagnosis not present

## 2022-08-02 LAB — COMPREHENSIVE METABOLIC PANEL
ALT: 26 U/L (ref 0–35)
AST: 17 U/L (ref 0–37)
Albumin: 4 g/dL (ref 3.5–5.2)
Alkaline Phosphatase: 55 U/L (ref 39–117)
BUN: 12 mg/dL (ref 6–23)
CO2: 24 mEq/L (ref 19–32)
Calcium: 8.9 mg/dL (ref 8.4–10.5)
Chloride: 103 mEq/L (ref 96–112)
Creatinine, Ser: 0.64 mg/dL (ref 0.40–1.20)
GFR: 118.56 mL/min (ref >60.00)
Glucose, Bld: 95 mg/dL (ref 70–99)
Potassium: 3.9 mEq/L (ref 3.5–5.1)
Sodium: 134 mEq/L — ABNORMAL LOW (ref 135–145)
Total Bilirubin: 0.4 mg/dL (ref 0.2–1.2)
Total Protein: 6.8 g/dL (ref 6.0–8.3)

## 2022-08-02 LAB — CBC WITH DIFFERENTIAL/PLATELET
Basophils Absolute: 0.1 10*3/uL (ref 0.0–0.1)
Basophils Relative: 1.1 % (ref 0.0–3.0)
Eosinophils Absolute: 0.2 10*3/uL (ref 0.0–0.7)
Eosinophils Relative: 2 % (ref 0.0–5.0)
HCT: 38 % (ref 36.0–46.0)
Hemoglobin: 12.7 g/dL (ref 12.0–15.0)
Lymphocytes Relative: 22.2 % (ref 12.0–46.0)
Lymphs Abs: 2.1 10*3/uL (ref 0.7–4.0)
MCHC: 33.4 g/dL (ref 30.0–36.0)
MCV: 87.4 fl (ref 78.0–100.0)
Monocytes Absolute: 0.7 10*3/uL (ref 0.1–1.0)
Monocytes Relative: 7.8 % (ref 3.0–12.0)
Neutro Abs: 6.3 10*3/uL (ref 1.4–7.7)
Neutrophils Relative %: 66.9 % (ref 43.0–77.0)
Platelets: 313 10*3/uL (ref 150.0–400.0)
RBC: 4.34 Mil/uL (ref 3.87–5.11)
RDW: 13.3 % (ref 11.5–15.5)
WBC: 9.4 10*3/uL (ref 4.0–10.5)

## 2022-08-02 LAB — LIPID PANEL
Cholesterol: 170 mg/dL (ref 0–200)
HDL: 40.7 mg/dL (ref >39.00)
NonHDL: 129.02
Total CHOL/HDL Ratio: 4
Triglycerides: 232 mg/dL — ABNORMAL HIGH (ref 0.0–149.0)
VLDL: 46.4 mg/dL — ABNORMAL HIGH (ref 0.0–40.0)

## 2022-08-02 LAB — LDL CHOLESTEROL, DIRECT: Direct LDL: 109 mg/dL

## 2022-08-02 LAB — HEMOGLOBIN A1C: Hgb A1c MFr Bld: 5.6 % (ref 4.6–6.5)

## 2022-08-02 NOTE — Progress Notes (Signed)
Rachel Blevins 31 y.o.   Chief Complaint  Patient presents with  . Annual Exam    Pt here for physical [redacted] weeks pregnant, wanting labs,no concerns     HISTORY OF PRESENT ILLNESS: This is a 31 y.o. female here for annual exam. About [redacted] weeks pregnant.  Starts prenatal care with her gynecologist later next month. Overall doing well.  Has no complaints or medical concerns today.  HPI   Prior to Admission medications   Medication Sig Start Date End Date Taking? Authorizing Provider  ibuprofen (ADVIL) 200 MG tablet Take 200 mg by mouth every 6 (six) hours as needed for moderate pain.   Yes [provider]    Allergies  Allergen Reactions  . Mango Flavor [Flavoring Agent] Anaphylaxis, Itching and Swelling  . Latex Itching    "Facial itching"    Patient Active Problem List   Diagnosis Date Noted  . Reactive hypertension 06/28/2021  . Daytime sleepiness 06/28/2021  . Suspected sleep apnea 06/28/2021    Past Medical History:  Diagnosis Date  . Anemia   . Family history of adverse reaction to anesthesia    mother is hard to wake after surgery    Past Surgical History:  Procedure Laterality Date  . NO PAST SURGERIES    . TONSILLECTOMY AND ADENOIDECTOMY N/A 10/25/2021   Procedure: TONSILLECTOMY AND ADENOIDECTOMY;  Surgeon: Leta Baptist, MD;  Location: [facility];  Service: ENT;  Laterality: N/A;    Social History   Socioeconomic History  . Marital status: Single    Spouse name: Not on file  . Number of children: Not on file  . Years of education: Not on file  . Highest education level: Not on file  Occupational History  . Not on file  Tobacco Use  . Smoking status: Never  . Smokeless tobacco: Never  Vaping Use  . Vaping Use: Never used  Substance and Sexual Activity  . Alcohol use: Yes    Comment: social  . Drug use: No  . Sexual activity: Yes    Birth control/protection: I.U.D.  Other Topics Concern  . Not on file  Social History  Narrative   ** Merged History Encounter **       Social Determinants of Health   Financial Resource Strain: Not on file  Food Insecurity: Not on file  Transportation Needs: Not on file  Physical Activity: Not on file  Stress: Not on file  Social Connections: Not on file  Intimate Partner Violence: Not on file    Family History  Problem Relation Age of Onset  . Hypertension Mother   . Hypertension Father   . Hypertension Maternal Grandmother      Review of Systems  Constitutional: Negative.  Negative for chills and fever.  HENT: Negative.  Negative for congestion and sore throat.   Respiratory: Negative.  Negative for cough and shortness of breath.   Cardiovascular: Negative.  Negative for chest pain and palpitations.  Gastrointestinal: Negative.  Negative for abdominal pain, diarrhea, nausea and vomiting.  Genitourinary: Negative.   Skin: Negative.  Negative for rash.  Neurological: Negative.  Negative for dizziness and headaches.  All other systems reviewed and are negative.   Today's Vitals   08/02/22 1010  BP: 128/76  Pulse: 65  Temp: 98.9 F (37.2 C)  TempSrc: Oral  SpO2: 98%  Weight: 228 lb 8 oz (103.6 kg)  Height: 5\' 4"  (1.626 m)   Body mass index is 39.22 kg/m.  Physical Exam Vitals  reviewed.  Constitutional:      Appearance: Normal appearance.  HENT:     Head: Normocephalic.     Right Ear: Tympanic membrane, ear canal and external ear normal.     Left Ear: Tympanic membrane, ear canal and external ear normal.     Mouth/Throat:     Mouth: Mucous membranes are moist.     Pharynx: Oropharynx is clear.  Eyes:     Extraocular Movements: Extraocular movements intact.     Conjunctiva/sclera: Conjunctivae normal.     Pupils: Pupils are equal, round, and reactive to light.  Cardiovascular:     Rate and Rhythm: Normal rate and regular rhythm.     Pulses: Normal pulses.     Heart sounds: Normal heart sounds.  Pulmonary:     Effort: Pulmonary effort is  normal.     Breath sounds: Normal breath sounds.  Abdominal:     Palpations: Abdomen is soft.     Tenderness: There is no abdominal tenderness.  Musculoskeletal:     Cervical back: No tenderness.     Right lower leg: No edema.     Left lower leg: No edema.  Lymphadenopathy:     Cervical: No cervical adenopathy.  Skin:    General: Skin is warm and dry.     Capillary Refill: Capillary refill takes less than 2 seconds.  Neurological:     General: No focal deficit present.     Mental Status: She is alert and oriented to person, place, and time.  Psychiatric:        Mood and Affect: Mood normal.        Behavior: Behavior normal.     ASSESSMENT & PLAN: Problem List Items Addressed This Visit   None Visit Diagnoses     Routine general medical examination at a health care facility    -  Primary   Need for hepatitis C screening test       Relevant Orders   Hepatitis C antibody screen   Screening for deficiency anemia       Relevant Orders   CBC with Differential   Screening for lipoid disorders       Relevant Orders   Lipid panel   Screening for endocrine, metabolic and immunity disorder       Relevant Orders   Comprehensive metabolic panel   Hemoglobin A1c   First trimester pregnancy          Modifiable risk factors discussed with patient. Anticipatory guidance according to age provided. The following topics were also discussed: Social Determinants of Health Smoking.  Non-smoker Diet and nutrition Benefits of exercise Cancer family history review Vaccinations reviewed and recommendations Cardiovascular risk assessment and need for blood work First trimester pregnancy and need for gynecological evaluation on start of prenatal care.  Scheduled for November 20 with OB/GYN doctor. Mental health including depression and anxiety Fall and accident prevention  Patient Instructions  Mantenimiento de la salud en [place] Maintenance, Female Adoptar un estilo de  vida saludable y recibir atencin preventiva son importantes para promover la salud y Musician. Consulte al mdico sobre: El esquema adecuado para hacerse pruebas y exmenes peridicos. Cosas que puede hacer por su cuenta para prevenir enfermedades y [place] sano. Qu debo saber sobre la dieta, el peso y el ejercicio? Consuma una dieta saludable  Consuma una dieta que incluya muchas verduras, frutas, productos lcteos con bajo contenido de [place] y Advertising account planner. No consuma muchos alimentos ricos en grasas slidas,  azcares agregados o sodio. Mantenga un peso saludable El ndice de masa muscular [facility]) se [place] para identificar problemas de [place]. Proporciona una estimacin de la grasa corporal basndose en el peso y la altura. Su mdico puede ayudarle a Radiation protection practitioner [place] y a Scientist, forensic o Theatre manager un peso saludable. Haga ejercicio con regularidad Haga ejercicio con regularidad. Esta es una de las prcticas ms importantes que puede hacer por su salud. La [employer] de los adultos deben seguir estas pautas: Optometrist, al menos, 150 minutos de actividad fsica por semana. El ejercicio debe aumentar la frecuencia cardaca y Nature conservation officer transpirar (ejercicio de intensidad moderada). Hacer ejercicios de fortalecimiento por lo [employer] por semana. Agregue esto a su plan de ejercicio de intensidad moderada. Pase menos tiempo sentada. Incluso la actividad fsica ligera puede ser beneficiosa. Controle sus niveles de colesterol y lpidos en la sangre Comience a realizarse anlisis de lpidos y Research officer, trade union en la sangre a los 82 aos y luego reptalos cada 5 aos. Hgase controlar los niveles de colesterol con mayor frecuencia si: Sus niveles de lpidos y colesterol son altos. Es mayor de 36 aos. Presenta un alto riesgo de padecer enfermedades cardacas. Qu debo saber sobre las pruebas de deteccin del cncer? Segn su historia clnica y sus antecedentes familiares, es posible que deba realizarse  pruebas de deteccin del cncer en diferentes edades. Esto puede incluir pruebas de deteccin de lo siguiente: Cncer de mama. Cncer de cuello uterino. Cncer colorrectal. Cncer de piel. Cncer de pulmn. Qu debo saber sobre la enfermedad cardaca, la diabetes y la hipertensin arterial? Presin arterial y enfermedad cardaca La hipertensin arterial causa enfermedades cardacas y [place] el riesgo de accidente cerebrovascular. Es ms probable que esto se manifieste en las personas que tienen lecturas de presin arterial alta o tienen sobrepeso. Hgase controlar la presin arterial: Cada 3 a 5 aos si tiene entre 18 y 24 aos. Todos los aos si es mayor de 40 aos. Diabetes Realcese exmenes de deteccin de la diabetes con regularidad. Este anlisis revisa el nivel de azcar en la sangre en [place]. Hgase las pruebas de deteccin: Cada tres aos despus de los 16 aos de edad si tiene un peso normal y un bajo riesgo de padecer diabetes. Con ms frecuencia y a partir de [place] edad inferior si tiene sobrepeso o un alto riesgo de padecer diabetes. Qu debo saber sobre la prevencin de infecciones? Hepatitis B Si tiene un riesgo ms alto de contraer hepatitis B, debe someterse a un examen de deteccin de este virus. Hable con el mdico para averiguar si tiene riesgo de contraer la infeccin por hepatitis B. Hepatitis C Se recomienda el anlisis a: [employer] 1945 y 1965. Todas las personas que tengan un riesgo de haber contrado hepatitis C. Enfermedades de transmisin sexual (ETS) Hgase las pruebas de Programme researcher, broadcasting/film/video de ITS, incluidas la gonorrea y la clamidia, si: Es sexualmente activa y es menor de 103 aos. Es mayor de 54 aos, y Investment banker, operational informa que corre riesgo de tener este tipo de infecciones. La actividad sexual ha cambiado desde que le hicieron la ltima prueba de deteccin y tiene un riesgo mayor de Best boy clamidia o Radio broadcast assistant. Pregntele al mdico si usted tiene  riesgo. Pregntele al mdico si usted tiene un alto riesgo de Museum/gallery curator VIH. El mdico tambin puede recomendarle un medicamento recetado para ayudar a evitar la infeccin por el VIH. Si elige tomar medicamentos para prevenir el VIH, primero debe [employer] de deteccin del  VIH. Luego debe hacerse anlisis cada 3 meses mientras est tomando los medicamentos. Embarazo Si est por dejar de Librarian, academic (fase premenopusica) y usted puede quedar [place], busque asesoramiento antes de [place]. Tome de 400 a 800 microgramos (mcg) de cido [employer] si [place]. Pida mtodos de control de la natalidad (anticonceptivos) si desea evitar un embarazo no deseado. Osteoporosis y [place] La osteoporosis es una enfermedad en la que los huesos pierden los minerales y la fuerza por el avance de la edad. El resultado pueden ser fracturas en los [place]. Si tiene 70 aos o ms, o si est en riesgo de sufrir osteoporosis y fracturas, pregunte a su mdico si debe: Hacerse pruebas de deteccin de prdida sea. Tomar un suplemento de calcio o de vitamina D para reducir el riesgo de fracturas. Recibir terapia de reemplazo hormonal (TRH) para tratar los sntomas de la menopausia. Siga estas indicaciones en su casa: Consumo de alcohol No beba alcohol si: Su mdico le indica no hacerlo. Est embarazada, puede estar embarazada o est tratando de [place]. Si bebe alcohol: Limite la cantidad que bebe a lo siguiente: De 0 a 1 bebida por da. Sepa cunta cantidad de alcohol hay en las bebidas que toma. En los [place], una medida equivale a una botella de cerveza de 12 oz (355 ml), un vaso de vino de 5 oz (148 ml) o un vaso de una bebida alcohlica de alta graduacin de 1 oz (44 ml). Estilo de vida No consuma ningn producto que contenga nicotina o tabaco. Estos productos incluyen cigarrillos, tabaco para Higher education careers adviser y aparatos de vapeo, como los Psychologist, sport and exercise.  Si necesita ayuda para dejar de consumir estos productos, consulte al mdico. No consuma drogas. No comparta agujas. Solicite ayuda a su mdico si necesita apoyo o informacin para abandonar las drogas. Indicaciones generales Realcese los estudios de rutina de la salud, dentales y de Public librarian. [place]. Infrmele a su mdico si: Se siente deprimida con frecuencia. Alguna vez ha sido vctima de [place] o no se siente seguro en su casa. Resumen Adoptar un estilo de vida saludable y recibir atencin preventiva son importantes para promover la salud y Musician. Siga las instrucciones del mdico acerca de una dieta saludable, el ejercicio y la realizacin de pruebas o exmenes para Engineer, building services. Siga las instrucciones del mdico con respecto al control del colesterol y la presin arterial. Esta informacin no tiene Marine scientist el consejo del mdico. Asegrese de hacerle al mdico cualquier pregunta que tenga. Document Revised: 02/25/2021 Document Reviewed: 02/25/2021 Elsevier Patient Education  [place], MD [facility]

## 2022-08-02 NOTE — Patient Instructions (Signed)

## 2022-08-03 LAB — HEPATITIS C ANTIBODY: Hepatitis C Ab: NONREACTIVE

## 2022-08-22 DIAGNOSIS — N911 Secondary amenorrhea: Secondary | ICD-10-CM | POA: Diagnosis not present

## 2022-08-30 DIAGNOSIS — Z3685 Encounter for antenatal screening for Streptococcus B: Secondary | ICD-10-CM | POA: Diagnosis not present

## 2022-08-30 DIAGNOSIS — Z3A09 9 weeks gestation of pregnancy: Secondary | ICD-10-CM | POA: Diagnosis not present

## 2022-08-30 DIAGNOSIS — Z3481 Encounter for supervision of other normal pregnancy, first trimester: Secondary | ICD-10-CM | POA: Diagnosis not present

## 2022-08-30 LAB — OB RESULTS CONSOLE RUBELLA ANTIBODY, IGM: Rubella: IMMUNE

## 2022-08-30 LAB — OB RESULTS CONSOLE HEPATITIS B SURFACE ANTIGEN: Hepatitis B Surface Ag: NEGATIVE

## 2022-08-30 LAB — HEPATITIS C ANTIBODY: HCV Ab: NEGATIVE

## 2022-08-30 LAB — OB RESULTS CONSOLE RPR: RPR: NONREACTIVE

## 2022-08-30 LAB — OB RESULTS CONSOLE HIV ANTIBODY (ROUTINE TESTING): HIV: NONREACTIVE

## 2022-09-06 DIAGNOSIS — Z3400 Encounter for supervision of normal first pregnancy, unspecified trimester: Secondary | ICD-10-CM | POA: Diagnosis not present

## 2022-09-06 DIAGNOSIS — Z113 Encounter for screening for infections with a predominantly sexual mode of transmission: Secondary | ICD-10-CM | POA: Diagnosis not present

## 2022-09-06 DIAGNOSIS — Z3A10 10 weeks gestation of pregnancy: Secondary | ICD-10-CM | POA: Diagnosis not present

## 2022-09-06 DIAGNOSIS — Z331 Pregnant state, incidental: Secondary | ICD-10-CM | POA: Diagnosis not present

## 2022-09-06 LAB — OB RESULTS CONSOLE GC/CHLAMYDIA
Chlamydia: NEGATIVE
Neisseria Gonorrhea: NEGATIVE

## 2022-10-03 NOTE — L&D Delivery Note (Signed)
Delivery Note At 4:43 AM a viable female was delivered via Vaginal, Spontaneous (Presentation: Left Occiput Anterior).  APGAR: 8, 9; weight pending.   Placenta status: Spontaneous, Intact.  Cord: 3 vessels with the following complications: None.  Cord pH: n/a  Anesthesia: Epidural Episiotomy: None Lacerations: Vaginal Suture Repair: 3.0 vicryl rapide Est. Blood Loss (mL): 125  Mom to postpartum.  Baby to Couplet care / Skin to Skin.  [name] 03/31/2023, 5:04 AM

## 2022-11-08 DIAGNOSIS — Z3482 Encounter for supervision of other normal pregnancy, second trimester: Secondary | ICD-10-CM | POA: Diagnosis not present

## 2022-11-08 DIAGNOSIS — Z3A19 19 weeks gestation of pregnancy: Secondary | ICD-10-CM | POA: Diagnosis not present

## 2022-11-08 DIAGNOSIS — Z363 Encounter for antenatal screening for malformations: Secondary | ICD-10-CM | POA: Diagnosis not present

## 2022-11-08 DIAGNOSIS — Z361 Encounter for antenatal screening for raised alphafetoprotein level: Secondary | ICD-10-CM | POA: Diagnosis not present

## 2022-12-07 IMAGING — US US PELVIS COMPLETE WITH TRANSVAGINAL
1 series · 13 of 25 positions shown · non-contrast
Comparison: None

CLINICAL DATA: Unable to locate IUD

EXAM:
TRANSABDOMINAL AND TRANSVAGINAL ULTRASOUND OF PELVIS
TECHNIQUE: Both transabdominal and transvaginal ultrasound examinations of the
pelvis were performed. Transabdominal technique was performed for
global imaging of the pelvis including uterus, ovaries, adnexal
regions, and pelvic cul-de-sac. It was necessary to proceed with
endovaginal exam following the transabdominal exam to visualize the
uterus endometrium ovaries.

[Series 1: us pelvic complete with transvaginal · 88 acquisitions, 13 frames shown]
[im 1/88]
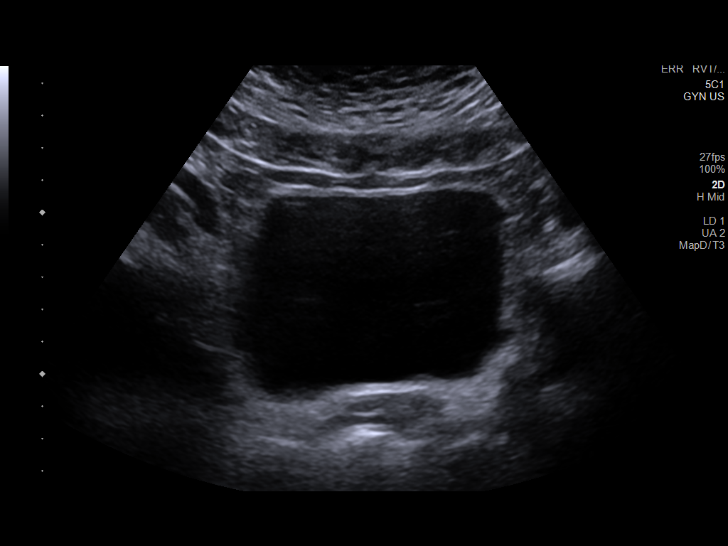
[im 8/88]
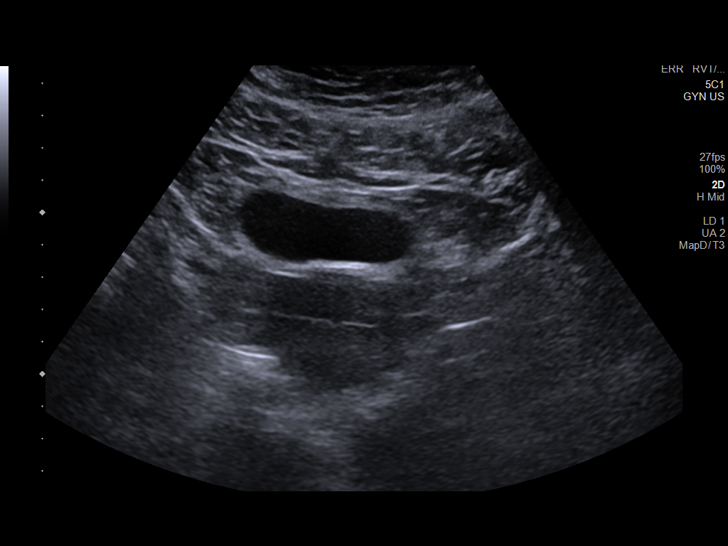
[im 15/88]
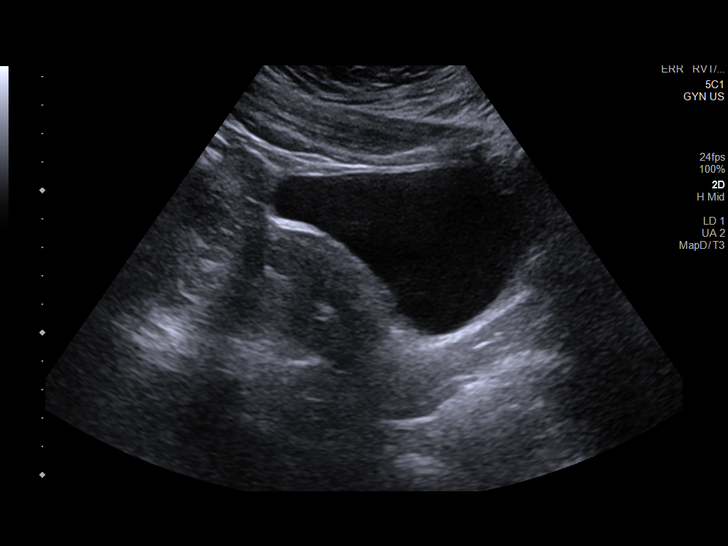
[im 22/88]
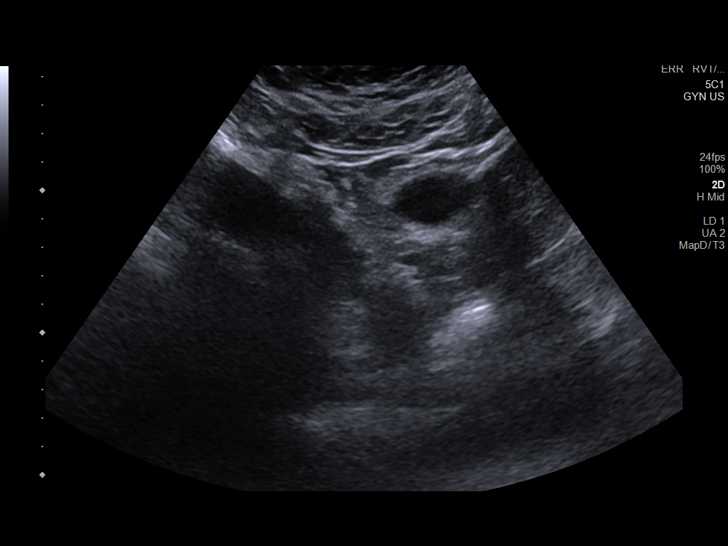
[im 30/88]
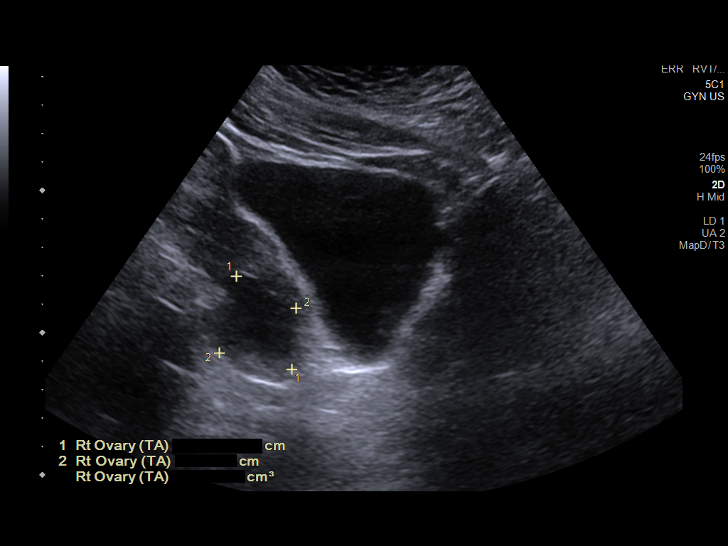
[im 37/88]
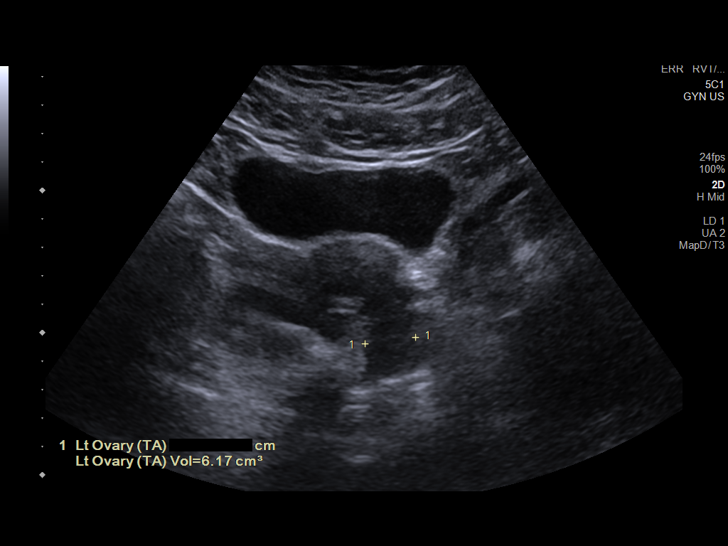
[im 44/88]
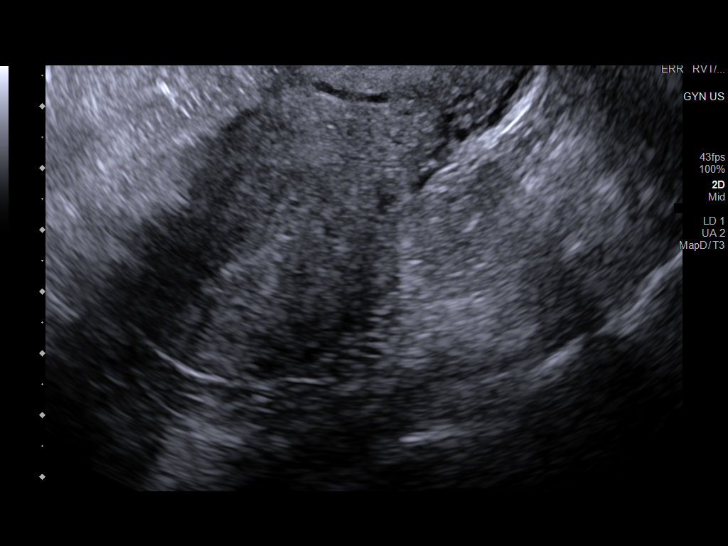
[im 51/88]
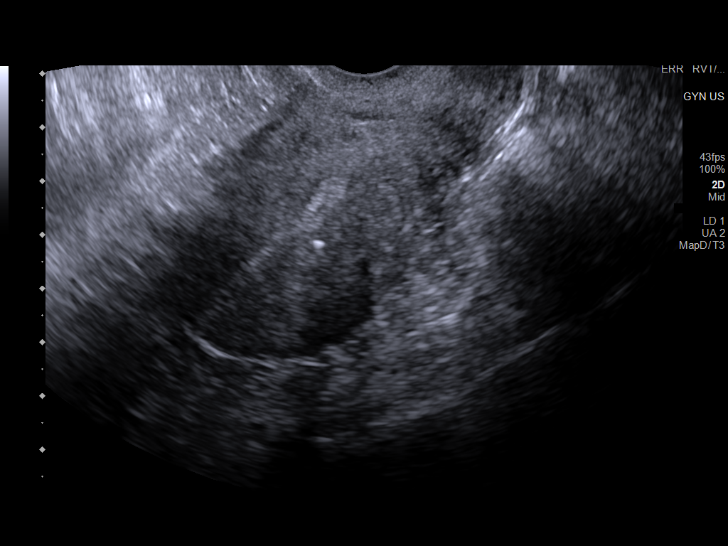
[im 59/88]
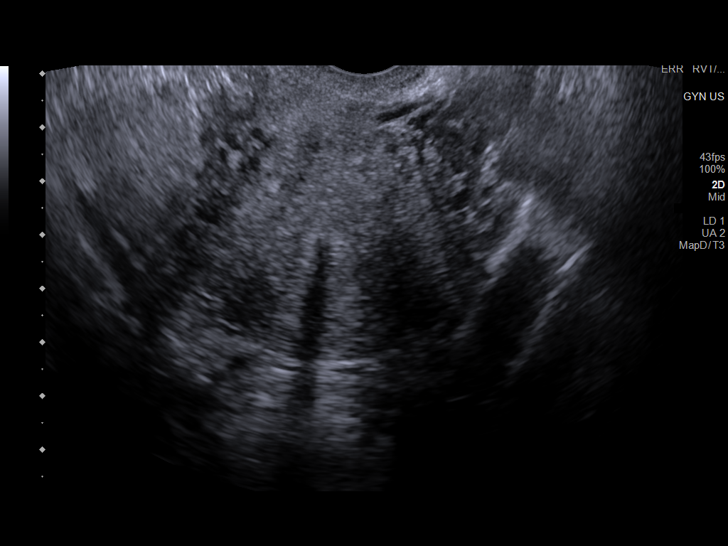
[im 66/88]
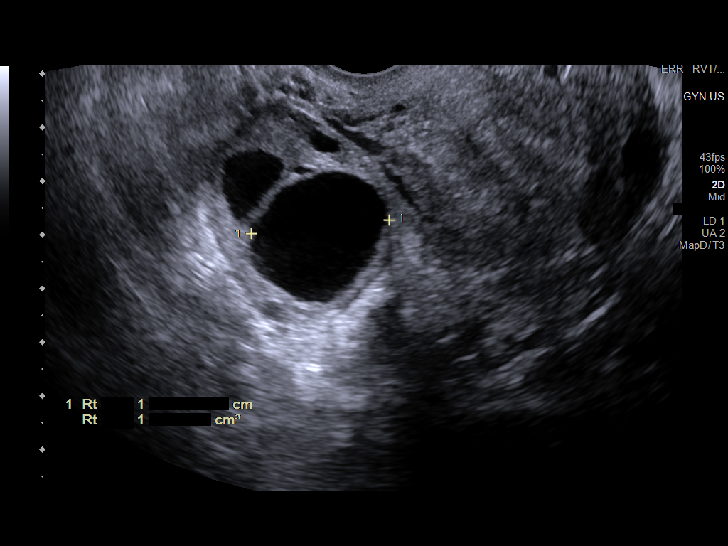
[im 73/88]
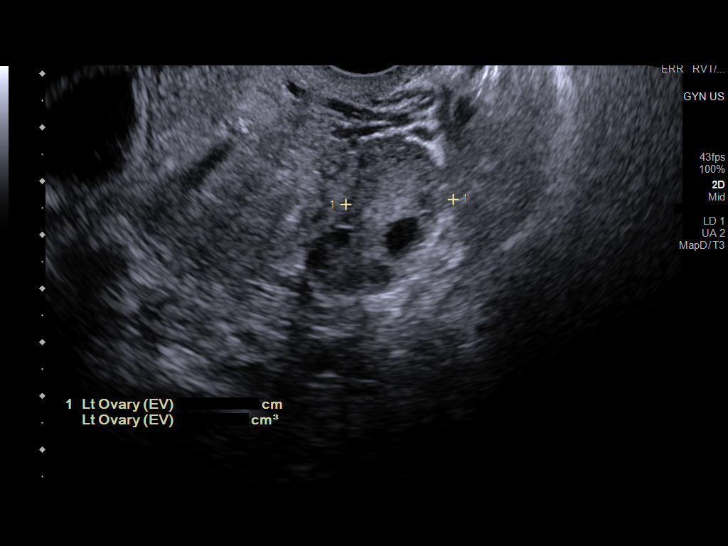
[im 80/88]
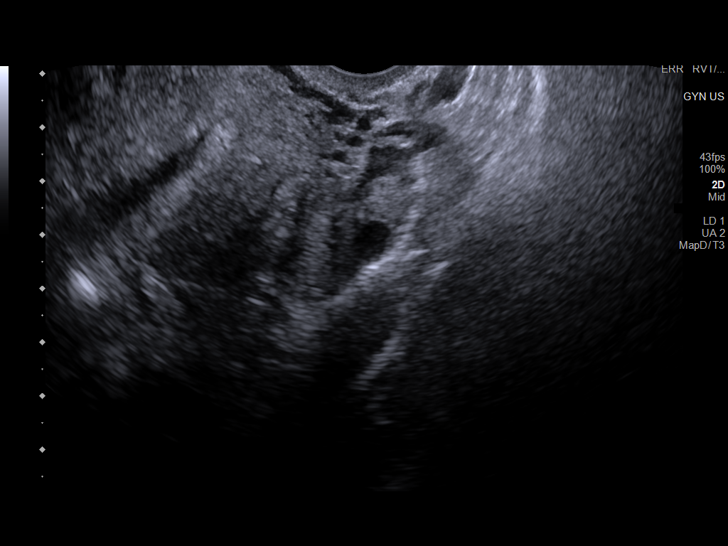
[im 88/88]
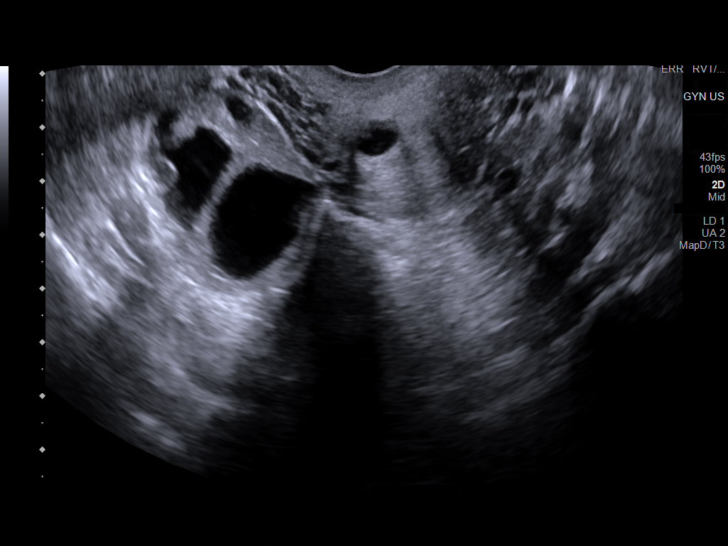

[13 of 25 positions shown; findings below may reference images not displayed]

FINDINGS: Uterus

Measurements: 8.1 x 4 x 5.5 cm = volume: 95.1 mL. No fibroids or
other mass visualized.

Endometrium

Thickness: 2.7 mm.  IUD within the uterus.

Right ovary

Measurements: 4.2 x 4.1 x 3.6 cm = volume: 32 mL. Cyst measuring
x 2.3 x 2.6 cm.

Left ovary

Measurements: 3.2 x 1.9 x 2 cm = volume: 6.7 mL. Normal
appearance/no adnexal mass.

Other findings

No abnormal free fluid.
IMPRESSION: 1. IUD appears grossly appropriate in position within the uterus by
sonography.
2. 2.8 cm right ovarian cyst. No followup imaging recommended. Note:
This recommendation does not apply to premenarchal patients or to
those with increased risk (genetic, family history, elevated tumor
markers or other high-risk factors) of ovarian cancer. Reference:
Radiology [DATE]):[id number].

## 2023-01-10 DIAGNOSIS — Z3480 Encounter for supervision of other normal pregnancy, unspecified trimester: Secondary | ICD-10-CM | POA: Diagnosis not present

## 2023-02-13 DIAGNOSIS — Z3483 Encounter for supervision of other normal pregnancy, third trimester: Secondary | ICD-10-CM | POA: Diagnosis not present

## 2023-02-13 DIAGNOSIS — O99019 Anemia complicating pregnancy, unspecified trimester: Secondary | ICD-10-CM | POA: Diagnosis not present

## 2023-02-13 DIAGNOSIS — Z3A33 33 weeks gestation of pregnancy: Secondary | ICD-10-CM | POA: Diagnosis not present

## 2023-02-28 DIAGNOSIS — O139 Gestational [pregnancy-induced] hypertension without significant proteinuria, unspecified trimester: Secondary | ICD-10-CM | POA: Diagnosis not present

## 2023-03-08 DIAGNOSIS — Z361 Encounter for antenatal screening for raised alphafetoprotein level: Secondary | ICD-10-CM | POA: Diagnosis not present

## 2023-03-08 LAB — OB RESULTS CONSOLE GBS: GBS: NEGATIVE

## 2023-03-14 DIAGNOSIS — R809 Proteinuria, unspecified: Secondary | ICD-10-CM | POA: Diagnosis not present

## 2023-03-17 DIAGNOSIS — O99891 Other specified diseases and conditions complicating pregnancy: Secondary | ICD-10-CM | POA: Diagnosis not present

## 2023-03-17 DIAGNOSIS — Z3400 Encounter for supervision of normal first pregnancy, unspecified trimester: Secondary | ICD-10-CM | POA: Diagnosis not present

## 2023-03-17 DIAGNOSIS — Z3A37 37 weeks gestation of pregnancy: Secondary | ICD-10-CM | POA: Diagnosis not present

## 2023-03-22 DIAGNOSIS — Z3400 Encounter for supervision of normal first pregnancy, unspecified trimester: Secondary | ICD-10-CM | POA: Diagnosis not present

## 2023-03-22 DIAGNOSIS — O99891 Other specified diseases and conditions complicating pregnancy: Secondary | ICD-10-CM | POA: Diagnosis not present

## 2023-03-22 DIAGNOSIS — Z3A38 38 weeks gestation of pregnancy: Secondary | ICD-10-CM | POA: Diagnosis not present

## 2023-03-28 ENCOUNTER — Encounter (HOSPITAL_COMMUNITY): Payer: Self-pay | Admitting: *Deleted

## 2023-03-28 ENCOUNTER — Telehealth (HOSPITAL_COMMUNITY): Payer: Self-pay | Admitting: *Deleted

## 2023-03-28 DIAGNOSIS — O139 Gestational [pregnancy-induced] hypertension without significant proteinuria, unspecified trimester: Secondary | ICD-10-CM | POA: Diagnosis not present

## 2023-03-28 DIAGNOSIS — Z3A39 39 weeks gestation of pregnancy: Secondary | ICD-10-CM | POA: Diagnosis not present

## 2023-03-28 DIAGNOSIS — Z3483 Encounter for supervision of other normal pregnancy, third trimester: Secondary | ICD-10-CM | POA: Diagnosis not present

## 2023-03-28 NOTE — Telephone Encounter (Signed)
Preadmission screen  

## 2023-03-29 ENCOUNTER — Encounter (HOSPITAL_COMMUNITY): Payer: Self-pay | Admitting: *Deleted

## 2023-03-30 ENCOUNTER — Encounter (HOSPITAL_COMMUNITY): Payer: Self-pay | Admitting: Obstetrics and Gynecology

## 2023-03-30 ENCOUNTER — Inpatient Hospital Stay (HOSPITAL_COMMUNITY)
Admission: AD | Admit: 2023-03-30 | Discharge: 2023-04-01 | DRG: 807 | Disposition: A | Discharge status: Home / Self Care | Payer: 59 | Attending: Obstetrics & Gynecology | Admitting: Obstetrics & Gynecology

## 2023-03-30 ENCOUNTER — Other Ambulatory Visit: Payer: Self-pay

## 2023-03-30 ENCOUNTER — Inpatient Hospital Stay (HOSPITAL_COMMUNITY): Payer: 59 | Admitting: Anesthesiology

## 2023-03-30 DIAGNOSIS — O1494 Unspecified pre-eclampsia, complicating childbirth: Principal | ICD-10-CM | POA: Diagnosis present

## 2023-03-30 DIAGNOSIS — O99214 Obesity complicating childbirth: Secondary | ICD-10-CM | POA: Diagnosis not present

## 2023-03-30 DIAGNOSIS — O1404 Mild to moderate pre-eclampsia, complicating childbirth: Secondary | ICD-10-CM | POA: Diagnosis present

## 2023-03-30 DIAGNOSIS — O1490 Unspecified pre-eclampsia, unspecified trimester: Secondary | ICD-10-CM | POA: Diagnosis present

## 2023-03-30 DIAGNOSIS — Z3A39 39 weeks gestation of pregnancy: Secondary | ICD-10-CM

## 2023-03-30 DIAGNOSIS — O134 Gestational [pregnancy-induced] hypertension without significant proteinuria, complicating childbirth: Secondary | ICD-10-CM | POA: Diagnosis not present

## 2023-03-30 LAB — CBC
HCT: 34.8 % — ABNORMAL LOW (ref 36.0–46.0)
Hemoglobin: 11.5 g/dL — ABNORMAL LOW (ref 12.0–15.0)
MCH: 27.4 pg (ref 26.0–34.0)
MCHC: 33 g/dL (ref 30.0–36.0)
MCV: 83.1 fL (ref 80.0–100.0)
Platelets: 341 10*3/uL (ref 150–400)
RBC: 4.19 MIL/uL (ref 3.87–5.11)
RDW: 15 % (ref 11.5–15.5)
WBC: 11.3 10*3/uL — ABNORMAL HIGH (ref 4.0–10.5)
nRBC: 0 % (ref 0.0–0.2)

## 2023-03-30 LAB — COMPREHENSIVE METABOLIC PANEL
ALT: 13 U/L (ref 0–44)
AST: 13 U/L — ABNORMAL LOW (ref 15–41)
Albumin: 2.4 g/dL — ABNORMAL LOW (ref 3.5–5.0)
Alkaline Phosphatase: 105 U/L (ref 38–126)
Anion gap: 11 (ref 5–15)
BUN: 9 mg/dL (ref 6–20)
CO2: 21 mmol/L — ABNORMAL LOW (ref 22–32)
Calcium: 8.8 mg/dL — ABNORMAL LOW (ref 8.9–10.3)
Chloride: 104 mmol/L (ref 98–111)
Creatinine, Ser: 0.7 mg/dL (ref 0.44–1.00)
GFR, Estimated: >60 mL/min (ref >60)
Glucose, Bld: 104 mg/dL — ABNORMAL HIGH (ref 70–99)
Potassium: 4.4 mmol/L (ref 3.5–5.1)
Sodium: 136 mmol/L (ref 135–145)
Total Bilirubin: 0.3 mg/dL (ref 0.3–1.2)
Total Protein: 5.7 g/dL — ABNORMAL LOW (ref 6.5–8.1)

## 2023-03-30 LAB — TYPE AND SCREEN
ABO/RH(D): A POS
Antibody Screen: NEGATIVE

## 2023-03-30 LAB — HIV ANTIBODY (ROUTINE TESTING W REFLEX): HIV Screen 4th Generation wRfx: NONREACTIVE

## 2023-03-30 MED ORDER — ONDANSETRON HCL 4 MG/2ML IJ SOLN
4.0000 mg | Freq: Four times a day (QID) | INTRAMUSCULAR | Status: DC | PRN
  Administered 2023-03-30: 4 mg via INTRAVENOUS
  Filled 2023-03-30: qty 2

## 2023-03-30 MED ORDER — FENTANYL-BUPIVACAINE-NACL 0.5-0.125-0.9 MG/250ML-% EP SOLN
12.0000 mL/h | EPIDURAL | Status: DC | PRN
  Administered 2023-03-30: 12 mL/h via EPIDURAL
  Filled 2023-03-30: qty 250

## 2023-03-30 MED ORDER — LACTATED RINGERS IV SOLN
500.0000 mL | INTRAVENOUS | Status: DC | PRN
  Administered 2023-03-30: 500 mL via INTRAVENOUS

## 2023-03-30 MED ORDER — LACTATED RINGERS IV SOLN
500.0000 mL | Freq: Once | INTRAVENOUS | Status: AC
  Administered 2023-03-30: 500 mL via INTRAVENOUS

## 2023-03-30 MED ORDER — PHENYLEPHRINE 80 MCG/ML (10ML) SYRINGE FOR IV PUSH (FOR BLOOD PRESSURE SUPPORT): 80.0000 ug | PREFILLED_SYRINGE | INTRAVENOUS | Status: DC | PRN

## 2023-03-30 MED ORDER — LACTATED RINGERS IV SOLN: INTRAVENOUS | Status: DC

## 2023-03-30 MED ORDER — OXYCODONE-ACETAMINOPHEN 5-325 MG PO TABS: 1.0000 | ORAL_TABLET | ORAL | Status: DC | PRN

## 2023-03-30 MED ORDER — SOD CITRATE-CITRIC ACID 500-334 MG/5ML PO SOLN: 30.0000 mL | ORAL | Status: DC | PRN

## 2023-03-30 MED ORDER — OXYTOCIN-SODIUM CHLORIDE 30-0.9 UT/500ML-% IV SOLN
1.0000 m[IU]/min | INTRAVENOUS | Status: DC
  Administered 2023-03-30: 2 m[IU]/min via INTRAVENOUS

## 2023-03-30 MED ORDER — OXYTOCIN-SODIUM CHLORIDE 30-0.9 UT/500ML-% IV SOLN
2.5000 [IU]/h | INTRAVENOUS | Status: DC
  Administered 2023-03-31: 2.5 [IU]/h via INTRAVENOUS
  Filled 2023-03-30: qty 500

## 2023-03-30 MED ORDER — LIDOCAINE HCL (PF) 1 % IJ SOLN: 30.0000 mL | INTRAMUSCULAR | Status: DC | PRN

## 2023-03-30 MED ORDER — DIPHENHYDRAMINE HCL 50 MG/ML IJ SOLN: 12.5000 mg | INTRAMUSCULAR | Status: DC | PRN

## 2023-03-30 MED ORDER — TERBUTALINE SULFATE 1 MG/ML IJ SOLN: 0.2500 mg | Freq: Once | INTRAMUSCULAR | Status: DC | PRN

## 2023-03-30 MED ORDER — FENTANYL CITRATE (PF) 100 MCG/2ML IJ SOLN: 50.0000 ug | INTRAMUSCULAR | Status: DC | PRN

## 2023-03-30 MED ORDER — EPHEDRINE 5 MG/ML INJ: 10.0000 mg | INTRAVENOUS | Status: DC | PRN

## 2023-03-30 MED ORDER — OXYCODONE-ACETAMINOPHEN 5-325 MG PO TABS: 2.0000 | ORAL_TABLET | ORAL | Status: DC | PRN

## 2023-03-30 MED ORDER — LIDOCAINE-EPINEPHRINE (PF) 2 %-1:200000 IJ SOLN
INTRAMUSCULAR | Status: DC | PRN
  Administered 2023-03-30: 5 mL via EPIDURAL

## 2023-03-30 MED ORDER — ACETAMINOPHEN 325 MG PO TABS: 650.0000 mg | ORAL_TABLET | ORAL | Status: DC | PRN

## 2023-03-30 MED ORDER — OXYTOCIN BOLUS FROM INFUSION
333.0000 mL | Freq: Once | INTRAVENOUS | Status: AC
  Administered 2023-03-31: 333 mL via INTRAVENOUS

## 2023-03-30 NOTE — Anesthesia Preprocedure Evaluation (Signed)
Anesthesia Evaluation  Patient identified by MRN, date of birth, ID band Patient awake    Reviewed: Allergy & Precautions, NPO status , Patient's Chart, lab work & pertinent test results  Airway Mallampati: III  TM Distance: >3 FB Neck ROM: Full    Dental no notable dental hx.    Pulmonary neg pulmonary ROS   Pulmonary exam normal breath sounds clear to auscultation       Cardiovascular hypertension (preE without severe features), Normal cardiovascular exam Rhythm:Regular Rate:Normal     Neuro/Psych negative neurological ROS  negative psych ROS   GI/Hepatic negative GI ROS, Neg liver ROS,,,  Endo/Other    Morbid obesity (BMI 47)  Renal/GU negative Renal ROS  negative genitourinary   Musculoskeletal negative musculoskeletal ROS (+)    Abdominal   Peds  Hematology negative hematology ROS (+)   Anesthesia Other Findings IOL for PreE  Reproductive/Obstetrics (+) Pregnancy                             Anesthesia Physical Anesthesia Plan  ASA: 3  Anesthesia Plan: Epidural   Post-op Pain Management:    Induction:   PONV Risk Score and Plan: Treatment may vary due to age or medical condition  Airway Management Planned: Natural Airway  Additional Equipment:   Intra-op Plan:   Post-operative Plan:   Informed Consent: I have reviewed the patients History and Physical, chart, labs and discussed the procedure including the risks, benefits and alternatives for the proposed anesthesia with the patient or authorized representative who has indicated his/her understanding and acceptance.       Plan Discussed with: Anesthesiologist  Anesthesia Plan Comments: (Patient identified. Risks, benefits, options discussed with patient including but not limited to bleeding, infection, nerve damage, paralysis, failed block, incomplete pain control, headache, blood pressure changes, nausea, vomiting,  reactions to medication, itching, and post partum back pain. Confirmed with bedside nurse the patient's most recent platelet count. Confirmed with the patient that they are not taking any anticoagulation, have any bleeding history or any family history of bleeding disorders. Patient expressed understanding and wishes to proceed. All questions were answered. )       Anesthesia Quick Evaluation

## 2023-03-30 NOTE — Progress Notes (Signed)
Shironda Abrigo is a 32 y.o. G2P0101 at [redacted]w[redacted]d by ultrasound admitted for induction of labor due to pre-eclampsia.  Subjective: No HA, vision change, RUQ pain, CP/SOB.  Feeling pain with CTX.  Objective: BP (!) 170/90   Pulse 66   Temp 98 F (36.7 C) (Oral)   Resp 18   Ht 5\' 3"  (1.6 m)   Wt 121.3 kg   BMI 47.39 kg/m  No intake/output data recorded. No intake/output data recorded.  FHT:  FHR: 130 bpm, variability: moderate,  accelerations:  Present,  decelerations:  Absent UC:   regular, every 2-3 minutes SVE:  2-3/75/-2 Labs: Lab Results  Component Value Date   WBC 11.3 (H) 03/30/2023   HGB 11.5 (L) 03/30/2023   HCT 34.8 (L) 03/30/2023   MCV 83.1 03/30/2023   PLT 341 03/30/2023    Assessment / Plan: Induction of labor due to preeclampsia,  progressing well on pitocin  Labor: Progressing normally.  Will AROM after comfortable with CLEA; planning now. Preeclampsia:   severe range BPs in last 20-30 minutes but suspect pain related.  Will get CLEA and reassess.  If persistent severes despite adequate pain control, will start magnesium. Fetal Wellbeing:  Category I Pain Control:   Preparing to get CLEA now I/D:  n/a Anticipated MOD:  NSVD  [name], DO 03/30/2023, 6:02 PM

## 2023-03-30 NOTE — H&P (Signed)
Rachel Blevins is a 32 y.o. female G2P0101 at [redacted]w[redacted]d presenting for IOL secondary to pre-eclampsia without severe features.  Patient was seen in the office today and BP 154/92 and 3+ proteinuria.  No HA, vision change, RUQ pain, CP/SOB.  Previous pregnancy affected by Pre-E and she was induced at 36 weeks.  Patient has taken LDA this pregnancy and d/c'd at 36 weeks.  GBS negative.  OB History     Gravida  2   Para  1   Term  0   Preterm  1   AB  0   Living  1      SAB  0   IAB  0   Ectopic  0   Multiple      Live Births  1          Past Medical History:  Diagnosis Date   Anemia    Family history of adverse reaction to anesthesia    mother is hard to wake after surgery   History of pre-eclampsia in prior pregnancy, currently pregnant    Pregnancy induced hypertension    Past Surgical History:  Procedure Laterality Date   NO PAST SURGERIES     TONSILLECTOMY AND ADENOIDECTOMY N/A 10/25/2021   Procedure: TONSILLECTOMY AND ADENOIDECTOMY;  Surgeon: Newman Pies, MD;  Location: [facility];  Service: ENT;  Laterality: N/A;   Family History: family history includes Hypertension in her father, maternal grandmother, and mother. Social History:  reports that she has never smoked. She has never used smokeless tobacco. She reports current alcohol use. She reports that she does not use drugs.     Maternal Diabetes: No Genetic Screening: Declined Maternal Ultrasounds/Referrals: Normal Fetal Ultrasounds or other Referrals:  None Maternal Substance Abuse:  No Significant Maternal Medications:  None Significant Maternal Lab Results:  Group B Strep negative Number of Prenatal Visits:greater than 3 verified prenatal visits Other Comments:  None  Review of Systems Maternal Medical History:  Fetal activity: Perceived fetal activity is normal.   Last perceived fetal movement was within the past hour.   Prenatal complications: no prenatal complications Prenatal  Complications - Diabetes: none.   Dilation: 2 Effacement (%): 50 Station: -3 Exam by:: Ruthvik Barnaby MD There were no vitals taken for this visit. Maternal Exam:  Uterine Assessment: Contraction strength is mild.  Contraction frequency is rare.  Abdomen: Patient reports no abdominal tenderness. Fundal height is c/w dates.   Estimated fetal weight is 7#6.   Fetal presentation: vertex Introitus: Normal vulva. Pelvis: adequate for delivery.   Cervix: Cervix evaluated by digital exam.     Fetal Exam Fetal Monitor Review: Baseline rate: 130.  Variability: moderate (6-25 bpm).   Pattern: accelerations present and no decelerations.   Fetal State Assessment: Category I - tracings are normal.   Physical Exam Constitutional:      Appearance: Normal appearance.  HENT:     Head: Normocephalic and atraumatic.  Pulmonary:     Effort: Pulmonary effort is normal.  Abdominal:     Palpations: Abdomen is soft.  Genitourinary:    General: Normal vulva.  Musculoskeletal:        General: Normal range of motion.     Cervical back: Normal range of motion.  Skin:    General: Skin is warm and dry.  Neurological:     Mental Status: She is alert and oriented to person, place, and time.  Psychiatric:        Mood and Affect: Mood normal.  Behavior: Behavior normal.     Prenatal labs: ABO, Rh:  A pos Antibody:  Negative Rubella: Immune (11/28 0000) RPR: Nonreactive (11/28 0000)  HBsAg: Negative (11/28 0000)  HIV: Non-reactive (11/28 0000)  GBS: Negative/-- (06/05 0000)   Assessment/Plan: 32yo G2P0101 at [redacted]w[redacted]d with pre-eclampsia without severe features -Start pitocin -CLEA when desired -Monitor BPs and magnesium for persistent severe range BPs -Anticipate NSVD   [name] 03/30/2023, 1:46 PM

## 2023-03-30 NOTE — Anesthesia Procedure Notes (Signed)
Epidural Patient location during procedure: OB Start time: 03/30/2023 6:20 PM End time: 03/30/2023 6:30 PM  Staffing Anesthesiologist: Elmer Picker, MD Performed: anesthesiologist   Preanesthetic Checklist Completed: patient identified, IV checked, risks and benefits discussed, monitors and equipment checked, pre-op evaluation and timeout performed  Epidural Patient position: sitting Prep: DuraPrep and site prepped and draped Patient monitoring: continuous pulse ox, blood pressure, heart rate and cardiac monitor Approach: midline Location: L3-L4 Injection technique: LOR air  Needle:  Needle type: Tuohy  Needle gauge: 17 G Needle length: 9 cm Needle insertion depth: 6 cm Catheter type: closed end flexible Catheter size: 19 Gauge Catheter at skin depth: 11 cm Test dose: negative  Assessment Sensory level: T8 Events: blood not aspirated, no cerebrospinal fluid, injection not painful, no injection resistance, no paresthesia and negative IV test  Additional Notes Patient identified. Risks/Benefits/Options discussed with patient including but not limited to bleeding, infection, nerve damage, paralysis, failed block, incomplete pain control, headache, blood pressure changes, nausea, vomiting, reactions to medication both or allergic, itching and postpartum back pain. Confirmed with bedside nurse the patient's most recent platelet count. Confirmed with patient that they are not currently taking any anticoagulation, have any bleeding history or any family history of bleeding disorders. Patient expressed understanding and wished to proceed. All questions were answered. Sterile technique was used throughout the entire procedure. Please see nursing notes for vital signs. Test dose was given through epidural catheter and negative prior to continuing to dose epidural or start infusion. Warning signs of high block given to the patient including shortness of breath, tingling/numbness in hands,  complete motor block, or any concerning symptoms with instructions to call for help. Patient was given instructions on fall risk and not to get out of bed. All questions and concerns addressed with instructions to call with any issues or inadequate analgesia.  Reason for block:procedure for pain

## 2023-03-30 NOTE — Progress Notes (Signed)
Comfortable with CLEA SVE 3/75/-2, AROM, clear Continue pitocin FHT Cat I  [name], DO

## 2023-03-31 ENCOUNTER — Encounter (HOSPITAL_COMMUNITY): Payer: Self-pay | Admitting: Obstetrics & Gynecology

## 2023-03-31 LAB — CBC WITH DIFFERENTIAL/PLATELET
Abs Immature Granulocytes: 0.11 10*3/uL — ABNORMAL HIGH (ref 0.00–0.07)
Basophils Absolute: 0.1 10*3/uL (ref 0.0–0.1)
Basophils Relative: 0 %
Eosinophils Absolute: 0 10*3/uL (ref 0.0–0.5)
Eosinophils Relative: 0 %
HCT: 35 % — ABNORMAL LOW (ref 36.0–46.0)
Hemoglobin: 11.7 g/dL — ABNORMAL LOW (ref 12.0–15.0)
Immature Granulocytes: 1 %
Lymphocytes Relative: 3 %
Lymphs Abs: 0.6 10*3/uL — ABNORMAL LOW (ref 0.7–4.0)
MCH: 27.4 pg (ref 26.0–34.0)
MCHC: 33.4 g/dL (ref 30.0–36.0)
MCV: 82 fL (ref 80.0–100.0)
Monocytes Absolute: 1.1 10*3/uL — ABNORMAL HIGH (ref 0.1–1.0)
Monocytes Relative: 5 %
Neutro Abs: 18.1 10*3/uL — ABNORMAL HIGH (ref 1.7–7.7)
Neutrophils Relative %: 91 %
Platelets: 318 10*3/uL (ref 150–400)
RBC: 4.27 MIL/uL (ref 3.87–5.11)
RDW: 15.3 % (ref 11.5–15.5)
WBC: 19.9 10*3/uL — ABNORMAL HIGH (ref 4.0–10.5)
nRBC: 0 % (ref 0.0–0.2)

## 2023-03-31 LAB — RPR: RPR Ser Ql: NONREACTIVE

## 2023-03-31 MED ORDER — DIPHENHYDRAMINE HCL 25 MG PO CAPS: 25.0000 mg | ORAL_CAPSULE | Freq: Four times a day (QID) | ORAL | Status: DC | PRN

## 2023-03-31 MED ORDER — ONDANSETRON HCL 4 MG/2ML IJ SOLN: 4.0000 mg | INTRAMUSCULAR | Status: DC | PRN

## 2023-03-31 MED ORDER — COCONUT OIL OIL: 1.0000 | TOPICAL_OIL | Status: DC | PRN

## 2023-03-31 MED ORDER — TETANUS-DIPHTH-ACELL PERTUSSIS 5-2.5-18.5 LF-MCG/0.5 IM SUSY: 0.5000 mL | PREFILLED_SYRINGE | Freq: Once | INTRAMUSCULAR | Status: DC

## 2023-03-31 MED ORDER — ONDANSETRON HCL 4 MG PO TABS: 4.0000 mg | ORAL_TABLET | ORAL | Status: DC | PRN

## 2023-03-31 MED ORDER — PRENATAL MULTIVITAMIN CH
1.0000 | ORAL_TABLET | Freq: Every day | ORAL | Status: DC
  Filled 2023-03-31 (×2): qty 1

## 2023-03-31 MED ORDER — WITCH HAZEL-GLYCERIN EX PADS: 1.0000 | MEDICATED_PAD | CUTANEOUS | Status: DC | PRN

## 2023-03-31 MED ORDER — ZOLPIDEM TARTRATE 5 MG PO TABS: 5.0000 mg | ORAL_TABLET | Freq: Every evening | ORAL | Status: DC | PRN

## 2023-03-31 MED ORDER — SIMETHICONE 80 MG PO CHEW: 80.0000 mg | CHEWABLE_TABLET | ORAL | Status: DC | PRN

## 2023-03-31 MED ORDER — BENZOCAINE-MENTHOL 20-0.5 % EX AERO
1.0000 | INHALATION_SPRAY | CUTANEOUS | Status: DC | PRN
  Filled 2023-03-31: qty 56

## 2023-03-31 MED ORDER — DIBUCAINE (PERIANAL) 1 % EX OINT: 1.0000 | TOPICAL_OINTMENT | CUTANEOUS | Status: DC | PRN

## 2023-03-31 MED ORDER — IBUPROFEN 600 MG PO TABS
600.0000 mg | ORAL_TABLET | Freq: Four times a day (QID) | ORAL | Status: DC
  Administered 2023-03-31 – 2023-04-01 (×5): 600 mg via ORAL
  Filled 2023-03-31 (×5): qty 1

## 2023-03-31 MED ORDER — ACETAMINOPHEN 325 MG PO TABS
650.0000 mg | ORAL_TABLET | ORAL | Status: DC | PRN
  Administered 2023-03-31 (×2): 650 mg via ORAL
  Filled 2023-03-31 (×2): qty 2

## 2023-03-31 MED ORDER — SENNOSIDES-DOCUSATE SODIUM 8.6-50 MG PO TABS
2.0000 | ORAL_TABLET | Freq: Every day | ORAL | Status: DC
  Administered 2023-04-01: 2 via ORAL
  Filled 2023-03-31: qty 2

## 2023-03-31 MED ORDER — FERROUS SULFATE 325 (65 FE) MG PO TABS
325.0000 mg | ORAL_TABLET | Freq: Two times a day (BID) | ORAL | Status: DC
  Administered 2023-03-31 – 2023-04-01 (×2): 325 mg via ORAL
  Filled 2023-03-31 (×3): qty 1

## 2023-03-31 NOTE — Lactation Note (Signed)
This note was copied from a baby's chart. Lactation Consultation Note  Patient Name: Rachel Blevins [id number] Date: 03/31/2023 Age:31 hours  Reason for consult: Initial assessment;Term;Breastfeeding assistance   P2, [redacted]w[redacted]d  Mother requesting breastfeeding assistance. Mother is concerned that baby is unable to accommodate the size of her nipple and he is latching shallow.  Assited mother with latching in football, cross cradle and laid back position. Mother has lots of colostrum that is easily expressed. Infant is latching shallow, cheeks dimpling although swallowing was noted. Baby was on and off the breast due to not sustaining his latch. Encouraged mother to continue to latch as baby was making great strides with this feeding attempt.  Hand expressed 7 ml. Infant 2 ml by syringe feeding however baby did not like to suckle on gloved finger. Mother was given a slow flow nipple and may give EBM by bottle.  DEBP was set up with instructions. Mother to use if baby is unable to sustain latch.   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   Maternal Data Has patient been taught Hand Expression?: Yes Does the patient have breastfeeding experience prior to this delivery?: Yes How long did the patient breastfeed?: attempted, first baby born at 68 weeks, mother pumped for 8 months, copius milk supply  Feeding Mother's Current Feeding Choice: Formula  LATCH Score Latch: Repeated attempts needed to sustain latch, nipple held in mouth throughout feeding, stimulation needed to elicit sucking reflex.  Audible Swallowing: A few with stimulation  Type of Nipple: Everted at rest and after stimulation  Comfort (Breast/Nipple): Soft / non-tender  Hold (Positioning): Assistance needed to correctly position infant at breast and maintain latch.  LATCH Score: 7   Lactation Tools Discussed/Used Tools: Pump;Flanges Flange Size: 27 Breast pump type: Double-Electric  Breast Pump Pump Education: Setup, frequency, and cleaning Reason for Pumping: difficult latch Pumping frequency: pump when baby can not get a deep latch with his feeding effort  Interventions Interventions: Breast feeding basics reviewed;Assisted with latch;Skin to skin;Hand express;Breast compression;Adjust position;Support pillows;Position options;Education;DEBP;LC Services brochure  Discharge Pump: DEBP;Personal  Consult Status Consult Status: Follow-up Date: 04/01/23 Follow-up type: In-patient    Christella Hartigan M 03/31/2023, 5:41 PM

## 2023-04-01 ENCOUNTER — Encounter (HOSPITAL_COMMUNITY): Payer: Self-pay | Admitting: Obstetrics & Gynecology

## 2023-04-01 LAB — CBC
HCT: 32.4 % — ABNORMAL LOW (ref 36.0–46.0)
Hemoglobin: 10.6 g/dL — ABNORMAL LOW (ref 12.0–15.0)
MCH: 27.9 pg (ref 26.0–34.0)
MCHC: 32.7 g/dL (ref 30.0–36.0)
MCV: 85.3 fL (ref 80.0–100.0)
Platelets: 300 10*3/uL (ref 150–400)
RBC: 3.8 MIL/uL — ABNORMAL LOW (ref 3.87–5.11)
RDW: 15.7 % — ABNORMAL HIGH (ref 11.5–15.5)
WBC: 13.2 10*3/uL — ABNORMAL HIGH (ref 4.0–10.5)
nRBC: 0 % (ref 0.0–0.2)

## 2023-04-01 LAB — COMPREHENSIVE METABOLIC PANEL
ALT: 14 U/L (ref 0–44)
AST: 19 U/L (ref 15–41)
Albumin: 2.2 g/dL — ABNORMAL LOW (ref 3.5–5.0)
Alkaline Phosphatase: 96 U/L (ref 38–126)
Anion gap: 7 (ref 5–15)
BUN: 11 mg/dL (ref 6–20)
CO2: 19 mmol/L — ABNORMAL LOW (ref 22–32)
Calcium: 8.5 mg/dL — ABNORMAL LOW (ref 8.9–10.3)
Chloride: 109 mmol/L (ref 98–111)
Creatinine, Ser: 0.85 mg/dL (ref 0.44–1.00)
GFR, Estimated: >60 mL/min (ref >60)
Glucose, Bld: 81 mg/dL (ref 70–99)
Potassium: 4.3 mmol/L (ref 3.5–5.1)
Sodium: 135 mmol/L (ref 135–145)
Total Bilirubin: 0.5 mg/dL (ref 0.3–1.2)
Total Protein: 5.3 g/dL — ABNORMAL LOW (ref 6.5–8.1)

## 2023-04-01 LAB — BIRTH TISSUE RECOVERY COLLECTION (PLACENTA DONATION)

## 2023-04-01 MED ORDER — ACETAMINOPHEN 325 MG PO TABS
650.0000 mg | ORAL_TABLET | ORAL | 0 refills | Status: AC | PRN
  Filled 2023-04-01: qty 30, 3d supply, fill #0

## 2023-04-01 MED ORDER — IBUPROFEN 600 MG PO TABS
600.0000 mg | ORAL_TABLET | Freq: Four times a day (QID) | ORAL | 0 refills | Status: AC
  Filled 2023-04-01: qty 30, 8d supply, fill #0

## 2023-04-01 NOTE — Discharge Summary (Signed)
Obstetric Discharge Summary  Rachel Blevins is a 32 y.o. female that presented on 03/30/2023 for induction of labor for preeclampsia without severe features.  Her labor course was uncomplicated and she delivered a baby boy on 03/31/23. Blood pressures were elevated at delivery but improved without antihypertensives. Her postpartum course was uncomplicated and on PPD#1, she reported well controlled pain, spontaneous voiding, ambulating without difficulty, and tolerating PO.  She was stable for discharge home on 04/01/23 with plans for in-office follow up.  Hemoglobin  Date Value Ref Range Status  04/01/2023 10.6 (L) 12.0 - 15.0 g/dL Final   HCT  Date Value Ref Range Status  04/01/2023 32.4 (L) 36.0 - 46.0 % Final    Physical Exam:  General: alert and no distress Lochia: appropriate Uterine Fundus: firm DVT Evaluation: No evidence of DVT seen on physical exam.  Discharge Diagnoses: Preeclampsia without severe features, delivered  Discharge Information: Date: 04/01/2023 Activity: Pelvic rest, as tolerated Diet: routine Medications: Tylenol, motrin Condition: stable Instructions: Refer to practice specific booklet.  Discussed prior to discharge.  Discharge to: Home  Follow-up Information     [place] Follow up.   Why: Please follow up for a blood pressure check next week (the office will reach out to schedule), followed by a 6 week postpartum visit. Contact information: [address] [place] [address] [phone]                 Newborn Data: Live born female  Birth Weight: 7 lb (3175 g) APGAR: 8, 9  Newborn Delivery   Birth date/time: 03/31/2023 04:43:00 Delivery type: Vaginal, Spontaneous      Home with mother.  Lyn Henri 04/01/2023, 6:00 PM

## 2023-04-01 NOTE — Progress Notes (Signed)
Postpartum Progress Note  Post Partum Day 1 s/p spontaneous vaginal delivery.  Patient reports well-controlled pain, ambulating without difficulty, voiding spontaneously, tolerating PO.  Vaginal bleeding is appropriate.  She denies headache, vision change, RUQ or epigastric pain.   Objective: Blood pressure 135/78, pulse 69, temperature 98.2 F (36.8 C), temperature source Oral, resp. rate 16, height 5\' 3"  (1.6 m), weight 121.3 kg, SpO2 99 %, unknown if currently breastfeeding.  Physical Exam:  General: alert and no distress Lochia: appropriate Uterine Fundus: firm DVT Evaluation: No evidence of DVT seen on physical exam.  Recent Labs    03/31/23 0613 04/01/23 0458  HGB 11.7* 10.6*  HCT 35.0* 32.4*    Assessment/Plan: Postpartum Day 1, s/p vaginal delivery. Preeclampsia without severe features BP improved since delivery. No s/s of severe preeclampsia Baby boy - desires circ. Will perform if cleared by nursery.  Continue routine postpartum care Lactation following  Anticipate discharge home today   LOS: 2 days   Lyn Henri 04/01/2023, 8:07 AM

## 2023-04-01 NOTE — Anesthesia Postprocedure Evaluation (Signed)
Anesthesia Post Note  Patient: Rachel Blevins  Procedure(s) Performed: AN AD HOC LABOR EPIDURAL     Patient location during evaluation: Mother Baby Anesthesia Type: Epidural Level of consciousness: awake Pain management: satisfactory to patient Vital Signs Assessment: post-procedure vital signs reviewed and stable Respiratory status: spontaneous breathing Cardiovascular status: stable Anesthetic complications: no  No notable events documented.  Last Vitals:  Vitals:   04/01/23 0513 04/01/23 0635  BP: 137/81 135/78  Pulse: 67 69  Resp: 16   Temp:    SpO2: 99%     Last Pain:  Vitals:   04/01/23 0513  TempSrc: Oral  PainSc: 0-No pain   Pain Goal:                   Cephus Shelling

## 2023-04-03 ENCOUNTER — Other Ambulatory Visit (HOSPITAL_COMMUNITY): Payer: Self-pay

## 2023-04-03 ENCOUNTER — Inpatient Hospital Stay (HOSPITAL_COMMUNITY): Payer: 59

## 2023-04-05 ENCOUNTER — Other Ambulatory Visit: Payer: Self-pay

## 2023-04-05 ENCOUNTER — Encounter (HOSPITAL_COMMUNITY): Payer: Self-pay | Admitting: Obstetrics and Gynecology

## 2023-04-05 ENCOUNTER — Observation Stay (HOSPITAL_COMMUNITY)
Admission: AD | Admit: 2023-04-05 | Discharge: 2023-04-06 | Disposition: A | Discharge status: Home / Self Care | Payer: 59 | Attending: Obstetrics and Gynecology | Admitting: Obstetrics and Gynecology

## 2023-04-05 DIAGNOSIS — O165 Unspecified maternal hypertension, complicating the puerperium: Secondary | ICD-10-CM | POA: Insufficient documentation

## 2023-04-05 DIAGNOSIS — Z3A41 41 weeks gestation of pregnancy: Secondary | ICD-10-CM | POA: Diagnosis not present

## 2023-04-05 DIAGNOSIS — O1415 Severe pre-eclampsia, complicating the puerperium: Secondary | ICD-10-CM | POA: Diagnosis not present

## 2023-04-05 DIAGNOSIS — O169 Unspecified maternal hypertension, unspecified trimester: Secondary | ICD-10-CM | POA: Diagnosis not present

## 2023-04-05 DIAGNOSIS — Z91040 Latex allergy status: Secondary | ICD-10-CM | POA: Insufficient documentation

## 2023-04-05 DIAGNOSIS — O1490 Unspecified pre-eclampsia, unspecified trimester: Secondary | ICD-10-CM | POA: Diagnosis present

## 2023-04-05 LAB — CBC
HCT: 35.7 % — ABNORMAL LOW (ref 36.0–46.0)
Hemoglobin: 11.2 g/dL — ABNORMAL LOW (ref 12.0–15.0)
MCH: 26.4 pg (ref 26.0–34.0)
MCHC: 31.4 g/dL (ref 30.0–36.0)
MCV: 84 fL (ref 80.0–100.0)
Platelets: 370 10*3/uL (ref 150–400)
RBC: 4.25 MIL/uL (ref 3.87–5.11)
RDW: 15.4 % (ref 11.5–15.5)
WBC: 7.8 10*3/uL (ref 4.0–10.5)
nRBC: 0 % (ref 0.0–0.2)

## 2023-04-05 LAB — COMPREHENSIVE METABOLIC PANEL
ALT: 20 U/L (ref 0–44)
AST: 16 U/L (ref 15–41)
Albumin: 2.8 g/dL — ABNORMAL LOW (ref 3.5–5.0)
Alkaline Phosphatase: 83 U/L (ref 38–126)
Anion gap: 7 (ref 5–15)
BUN: 12 mg/dL (ref 6–20)
CO2: 26 mmol/L (ref 22–32)
Calcium: 9.2 mg/dL (ref 8.9–10.3)
Chloride: 104 mmol/L (ref 98–111)
Creatinine, Ser: 0.78 mg/dL (ref 0.44–1.00)
GFR, Estimated: >60 mL/min (ref >60)
Glucose, Bld: 96 mg/dL (ref 70–99)
Potassium: 4.4 mmol/L (ref 3.5–5.1)
Sodium: 137 mmol/L (ref 135–145)
Total Bilirubin: 0.2 mg/dL — ABNORMAL LOW (ref 0.3–1.2)
Total Protein: 6.2 g/dL — ABNORMAL LOW (ref 6.5–8.1)

## 2023-04-05 MED ORDER — MAGNESIUM SULFATE 40 GM/1000ML IV SOLN
2.0000 g/h | INTRAVENOUS | Status: AC
  Administered 2023-04-06: 2 g/h via INTRAVENOUS
  Filled 2023-04-05 (×2): qty 1000

## 2023-04-05 MED ORDER — ACETAMINOPHEN 325 MG PO TABS
650.0000 mg | ORAL_TABLET | Freq: Four times a day (QID) | ORAL | Status: DC | PRN
  Administered 2023-04-05 – 2023-04-06 (×3): 650 mg via ORAL
  Filled 2023-04-05 (×2): qty 2

## 2023-04-05 MED ORDER — ACETAMINOPHEN 325 MG PO TABS
ORAL_TABLET | ORAL | Status: AC
  Filled 2023-04-05: qty 2

## 2023-04-05 MED ORDER — LACTATED RINGERS IV SOLN: INTRAVENOUS | Status: DC

## 2023-04-05 MED ORDER — LABETALOL HCL 5 MG/ML IV SOLN
20.0000 mg | INTRAVENOUS | Status: DC | PRN
  Administered 2023-04-05: 20 mg via INTRAVENOUS
  Filled 2023-04-05: qty 4

## 2023-04-05 MED ORDER — MAGNESIUM SULFATE BOLUS VIA INFUSION
4.0000 g | Freq: Once | INTRAVENOUS | Status: AC
  Administered 2023-04-05: 4 g via INTRAVENOUS
  Filled 2023-04-05: qty 1000

## 2023-04-05 MED ORDER — LABETALOL HCL 5 MG/ML IV SOLN: 80.0000 mg | INTRAVENOUS | Status: DC | PRN

## 2023-04-05 MED ORDER — HYDRALAZINE HCL 20 MG/ML IJ SOLN
10.0000 mg | INTRAMUSCULAR | Status: DC | PRN
  Administered 2023-04-05: 10 mg via INTRAVENOUS
  Filled 2023-04-05: qty 1

## 2023-04-05 MED ORDER — NIFEDIPINE ER OSMOTIC RELEASE 30 MG PO TB24
30.0000 mg | ORAL_TABLET | Freq: Every day | ORAL | Status: DC
  Administered 2023-04-05: 30 mg via ORAL
  Filled 2023-04-05: qty 1

## 2023-04-05 MED ORDER — HYDRALAZINE HCL 20 MG/ML IJ SOLN: 10.0000 mg | INTRAMUSCULAR | Status: DC | PRN

## 2023-04-05 MED ORDER — IBUPROFEN 600 MG PO TABS
ORAL_TABLET | ORAL | Status: AC
  Filled 2023-04-05: qty 1

## 2023-04-05 MED ORDER — LABETALOL HCL 5 MG/ML IV SOLN: 40.0000 mg | INTRAVENOUS | Status: DC | PRN

## 2023-04-05 MED ORDER — IBUPROFEN 600 MG PO TABS
600.0000 mg | ORAL_TABLET | Freq: Four times a day (QID) | ORAL | Status: DC | PRN
  Administered 2023-04-05 – 2023-04-06 (×3): 600 mg via ORAL
  Filled 2023-04-05 (×2): qty 1

## 2023-04-05 MED ORDER — LABETALOL HCL 5 MG/ML IV SOLN
40.0000 mg | INTRAVENOUS | Status: DC | PRN
  Administered 2023-04-05: 40 mg via INTRAVENOUS

## 2023-04-05 MED ORDER — LABETALOL HCL 5 MG/ML IV SOLN
INTRAVENOUS | Status: AC
  Filled 2023-04-05: qty 8

## 2023-04-05 NOTE — Progress Notes (Signed)
At bedside to check on patient. She is tolerating magnesium well, denies headache, vision changes, RUQ pain.   BP 137/76   Pulse 75   Temp 98.1 F (36.7 C) (Oral)   Resp 18   Ht 5\' 3"  (1.6 m)   Wt 114.4 kg   SpO2 99%   Breastfeeding Yes Comment: pumping and bottle feeding  BMI 44.69 kg/m   Discussed improved BP after IV medications and likely a decrease in BP due to magnesium.  Will plan for procardia XL 30 mg tonight for ongoing BP control.   Nilda Simmer

## 2023-04-05 NOTE — Progress Notes (Signed)
Pt admitted to room 106. Plan of care reviewed. All questions answered. Pt/family oriented to room.

## 2023-04-05 NOTE — MAU Provider Note (Signed)
History     [id number]  Arrival date and time: 04/05/23 1140    Chief Complaint  Patient presents with   Hypertension     HPI Rachel Blevins is a 32 y.o. s/p NSVD on 03/31/2023 after IOL for mild pre-eclampsia, who presents for elevated BP's.   Review of discharge summary from last admission: IOL for mild pre-eclampsia. Unremarkable labor and postpartum course. No meds for HTN given at discharge.   Patient reports she went to office today to have a BP check and was sent to MAU due to high BP Has felt a little light headed of late but otherwise feels well Denies headache, vision changes, chest pain, shortness of breath, RUQ pain Bleeding is getting lighter   --/--/A POS (06/27 1400)  OB History     Gravida  2   Para  2   Term  1   Preterm  1   AB  0   Living  2      SAB  0   IAB  0   Ectopic  0   Multiple  0   Live Births  2           Past Medical History:  Diagnosis Date   Anemia    Family history of adverse reaction to anesthesia    mother is hard to wake after surgery   History of pre-eclampsia in prior pregnancy, currently pregnant    Pregnancy induced hypertension     Past Surgical History:  Procedure Laterality Date   NO PAST SURGERIES     TONSILLECTOMY AND ADENOIDECTOMY N/A 10/25/2021   Procedure: TONSILLECTOMY AND ADENOIDECTOMY;  Surgeon: Newman Pies, MD;  Location: [facility];  Service: ENT;  Laterality: N/A;    Family History  Problem Relation Age of Onset   Hypertension Mother    Hypertension Father    Hypertension Maternal Grandmother     Social History   Socioeconomic History   Marital status: Single    Spouse name: Not on file   Number of children: Not on file   Years of education: Not on file   Highest education level: Not on file  Occupational History   Not on file  Tobacco Use   Smoking status: Never   Smokeless tobacco: Never  Vaping Use   Vaping Use: Never used  Substance and Sexual Activity    Alcohol use: Not Currently    Comment: social   Drug use: No   Sexual activity: Not Currently    Birth control/protection: I.U.D.  Other Topics Concern   Not on file  Social History Narrative   ** Merged History Encounter **       Social Determinants of Health   Financial Resource Strain: Not on file  Food Insecurity: No Food Insecurity (03/30/2023)   Hunger Vital Sign    Worried About Running Out of Food in the Last Year: Never true    Ran Out of Food in the Last Year: Never true  Transportation Needs: No Transportation Needs (03/30/2023)   PRAPARE - Administrator, Civil Service (Medical): No    Lack of Transportation (Non-Medical): No  Physical Activity: Not on file  Stress: Not on file  Social Connections: Not on file  Intimate Partner Violence: Not At Risk (03/30/2023)   Humiliation, Afraid, Rape, and Kick questionnaire    Fear of Current or Ex-Partner: No    Emotionally Abused: No    Physically Abused: No    Sexually Abused:  No    Allergies  Allergen Reactions   Mango Flavor [Flavoring Agent] Anaphylaxis, Itching and Swelling   Latex Itching    "Facial itching"    No current facility-administered medications on file prior to encounter.   Current Outpatient Medications on File Prior to Encounter  Medication Sig Dispense Refill   ibuprofen (ADVIL) 600 MG tablet Take 1 tablet (600 mg total) by mouth every 6 (six) hours. 30 tablet 0   acetaminophen (TYLENOL) 325 MG tablet Take 2 tablets (650 mg total) by mouth every 4 (four) hours as needed (for pain scale < 4). 30 tablet 0     ROS Pertinent positives and negative per HPI, all others reviewed and negative  Physical Exam   BP (!) 171/89   Pulse (!) 51   Temp 98.1 F (36.7 C) (Oral)   Resp 18   Ht 5\' 3"  (1.6 m)   Wt 114.4 kg   SpO2 99%   BMI 44.69 kg/m   Patient Vitals for the past 24 hrs:  BP Temp Temp src Pulse Resp SpO2 Height Weight  04/05/23 1256 (!) 171/89 -- -- (!) 51 -- -- -- --   04/05/23 1245 -- -- -- 61 -- -- -- --  04/05/23 1231 (!) 161/89 -- -- (!) 59 -- -- -- --  04/05/23 1223 (!) 170/86 -- -- (!) 53 -- -- -- --  04/05/23 1211 (!) 190/86 98.1 F (36.7 C) Oral 62 18 99 % 5\' 3"  (1.6 m) 114.4 kg    Physical Exam Vitals reviewed.  Constitutional:      General: She is not in acute distress.    Appearance: She is well-developed. She is not diaphoretic.  Eyes:     General: No scleral icterus. Pulmonary:     Effort: Pulmonary effort is normal. No respiratory distress.  Skin:    General: Skin is warm and dry.  Neurological:     Mental Status: She is alert.     Coordination: Coordination normal.      Cervical Exam    Bedside Ultrasound Not performed.  My interpretation: n/a  FHT N/a  Labs No results found for this or any previous visit (from the past 24 hour(s)).  Imaging No results found.  MAU Course  Procedures Lab Orders         Comprehensive metabolic panel         CBC    Meds ordered this encounter  Medications   AND Linked Order Group    labetalol (NORMODYNE) injection 20 mg    labetalol (NORMODYNE) injection 40 mg    labetalol (NORMODYNE) injection 80 mg    hydrALAZINE (APRESOLINE) injection 10 mg   Imaging Orders  No imaging studies ordered today    MDM High (Level 5)  Assessment and Plan  #Postpartum severe pre-eclampsia Patient with multiple severe range BP's despite treatment with IV labetalol, now switching to hydralazine. Spoke with Dr. Lorane Gell who agrees with admission for postpartum severe pre-eclampsia, magnesium prophylaxis.     Dispo: admit to antepartum unit, care turned over to private physician    Venora Maples, MD/MPH 04/05/23 1:05 PM

## 2023-04-05 NOTE — H&P (Signed)
OB History and Physical   Rachel Blevins is a 32 y.o. female G2P1102 that was delivered on 03/31/23 via IOL for preeclampsia without severe features.  Blood pressure was well controlled upon discharge, but noted to be elevated at postpartum BP check in office today.   She received several doses of IV antihypertensives in MAU for severe range BP.   She denies headache, vision changes, RUQ or epigastric pain.    OB History     Gravida  2   Para  2   Term  1   Preterm  1   AB  0   Living  2      SAB  0   IAB  0   Ectopic  0   Multiple  0   Live Births  2          Past Medical History:  Diagnosis Date   Anemia    Family history of adverse reaction to anesthesia    mother is hard to wake after surgery   History of pre-eclampsia in prior pregnancy, currently pregnant    Pregnancy induced hypertension    Past Surgical History:  Procedure Laterality Date   NO PAST SURGERIES     TONSILLECTOMY AND ADENOIDECTOMY N/A 10/25/2021   Procedure: TONSILLECTOMY AND ADENOIDECTOMY;  Surgeon: Newman Pies, MD;  Location: [facility];  Service: ENT;  Laterality: N/A;   Family History: family history includes Hypertension in her father, maternal grandmother, and mother. Social History:  reports that she has never smoked. She has never used smokeless tobacco. She reports that she does not currently use alcohol. She reports that she does not use drugs.  Review of Systems - Patient denies fever, chills, SOB, CP, N/V/D.  History   Blood pressure 131/60, pulse 60, temperature 98.1 F (36.7 C), temperature source Oral, resp. rate 18, height 5\' 3"  (1.6 m), weight 114.4 kg, SpO2 99 %, unknown if currently breastfeeding. Exam Physical Exam   Gen: alert, well appearing, no distress Chest: nonlabored breathing CV: no peripheral edema Abdomen: soft, nontender Incision: dressing in place Ext: no evidence of DVT   Prenatal labs: ABO, Rh: --/--/A POS (06/27  1400) Antibody: NEG (06/27 1400) Rubella: Immune (11/28 0000) RPR: NON REACTIVE (06/27 1629)  HBsAg: Negative (11/28 0000)  HIV: Non Reactive (06/27 1629)  GBS: Negative/-- (06/05 0000)   Assessment/Plan: Admit to antepartum for postpartum preeclampsia S/p IV labetalol and hydralazine.  BP currently mild range on antepartum.  HELLP labs WNL 24 hr magnesium infusion.  Will monitor BP and likely initiate oral antihypertensive medication.  Will titrate as needed, including after discontinuation of magnesium.    Lyn Henri 04/05/2023, 1:41 PM

## 2023-04-05 NOTE — MAU Note (Signed)
Rachel Blevins is a 32 y.o.  here in MAU reporting: vag delivery on Fri 6/28. Induced for HTN.  Was not put on any meds at d/c.  Sent from dr's office today.  BP was really high.  "Has pre-E".  Denies HA, visual changes, epigastric pain, swelling is going down.   Onset of complaint: ongoing Pain score: none Vitals:   04/05/23 1211  BP: (!) 190/86  Pulse: 62  Resp: 18  Temp: 98.1 F (36.7 C)  SpO2: 99%     Lab orders placed from triage:

## 2023-04-06 MED ORDER — NIFEDIPINE ER 30 MG PO TB24: 30.0000 mg | ORAL_TABLET | Freq: Every day | ORAL | 1 refills | Status: AC

## 2023-04-06 NOTE — Plan of Care (Signed)
Problem: Education: Goal: Knowledge of disease or condition will improve Outcome: Adequate for Discharge Goal: Knowledge of the prescribed therapeutic regimen will improve Outcome: Adequate for Discharge   Problem: Fluid Volume: Goal: Peripheral tissue perfusion will improve Outcome: Adequate for Discharge   Problem: Clinical Measurements: Goal: Complications related to disease process, condition or treatment will be avoided or minimized Outcome: Adequate for Discharge   Problem: Education: Goal: Knowledge of Childbirth will improve Outcome: Adequate for Discharge Goal: Ability to make informed decisions regarding treatment and plan of care will improve Outcome: Adequate for Discharge Goal: Ability to state and carry out methods to decrease the pain will improve Outcome: Adequate for Discharge Goal: Individualized Educational Video(s) Outcome: Adequate for Discharge   Problem: Coping: Goal: Ability to verbalize concerns and feelings about labor and delivery will improve Outcome: Adequate for Discharge   Problem: Life Cycle: Goal: Ability to make normal progression through stages of labor will improve Outcome: Adequate for Discharge Goal: Ability to effectively push during vaginal delivery will improve Outcome: Adequate for Discharge   Problem: Role Relationship: Goal: Will demonstrate positive interactions with the child Outcome: Adequate for Discharge   Problem: Safety: Goal: Risk of complications during labor and delivery will decrease Outcome: Adequate for Discharge   Problem: Pain Management: Goal: Relief or control of pain from uterine contractions will improve Outcome: Adequate for Discharge   Problem: Education: Goal: Knowledge of condition will improve Outcome: Adequate for Discharge Goal: Individualized Educational Video(s) Outcome: Adequate for Discharge Goal: Individualized Newborn Educational Video(s) Outcome: Adequate for Discharge   Problem:  Activity: Goal: Will verbalize the importance of balancing activity with adequate rest periods Outcome: Adequate for Discharge Goal: Ability to tolerate increased activity will improve Outcome: Adequate for Discharge   Problem: Coping: Goal: Ability to identify and utilize available resources and services will improve Outcome: Adequate for Discharge   Problem: Life Cycle: Goal: Chance of risk for complications during the postpartum period will decrease Outcome: Adequate for Discharge   Problem: Role Relationship: Goal: Ability to demonstrate positive interaction with newborn will improve Outcome: Adequate for Discharge   Problem: Skin Integrity: Goal: Demonstration of wound healing without infection will improve Outcome: Adequate for Discharge   Problem: Education: Goal: Knowledge of General Education information will improve Description: Including pain rating scale, medication(s)/side effects and non-pharmacologic comfort measures Outcome: Adequate for Discharge   Problem: Health Behavior/Discharge Planning: Goal: Ability to manage health-related needs will improve Outcome: Adequate for Discharge   Problem: Clinical Measurements: Goal: Ability to maintain clinical measurements within normal limits will improve Outcome: Adequate for Discharge Goal: Will remain free from infection Outcome: Adequate for Discharge Goal: Diagnostic test results will improve Outcome: Adequate for Discharge Goal: Respiratory complications will improve Outcome: Adequate for Discharge Goal: Cardiovascular complication will be avoided Outcome: Adequate for Discharge   Problem: Activity: Goal: Risk for activity intolerance will decrease Outcome: Adequate for Discharge   Problem: Nutrition: Goal: Adequate nutrition will be maintained Outcome: Adequate for Discharge   Problem: Coping: Goal: Level of anxiety will decrease Outcome: Adequate for Discharge   Problem: Elimination: Goal: Will  not experience complications related to bowel motility Outcome: Adequate for Discharge Goal: Will not experience complications related to urinary retention Outcome: Adequate for Discharge   Problem: Pain Managment: Goal: General experience of comfort will improve Outcome: Adequate for Discharge   Problem: Safety: Goal: Ability to remain free from injury will improve Outcome: Adequate for Discharge   Problem: Skin Integrity: Goal: Risk for impaired skin integrity will decrease  Outcome: Adequate for Discharge   

## 2023-04-06 NOTE — Progress Notes (Signed)
Postpartum Progress Note  S: Doing well. No HA, RUQ pain, vision changes.  O:    04/06/2023    8:10 AM 04/06/2023    6:00 AM 04/06/2023    1:57 AM  Vitals with BMI  Systolic 137 132 161  Diastolic 77 70 62  Pulse 67 62 60     A/P: 32yo G2P1102 who as admitted for postpartum preeclampsia with severe features  Postpartum Pre-eclampsia with severe features - s/p IV lab/hydral in MAU - Mag for 24 hours for seizure ppx - dc around 2pm - Bps currently normal to mild range on Procardia 30XL - Asymptomatic. CBC/CMP without evidence of HELLP. - We discussed possible dc tonight vs tomorrow pending Bps after discontinuation of mag. Will be for close-interval BP check in the office.  Jule Economy, MD

## 2023-04-06 NOTE — Discharge Summary (Signed)
Physician Discharge Summary  Patient ID: Rachel Blevins MRN: [id number] DOB/AGE: April 28, 1991 32 y.o.  Admit date: 04/05/2023 Discharge date: 04/06/2023  Admission Diagnoses: postpartum severe preeclampsia  Discharge Diagnoses:  Principal Problem:   Pre-eclampsia, severe, postpartum condition Active Problems:   Preeclampsia   Discharged Condition: good  Hospital Course: Patient was admitted and started on magnesium after being treated with multiple doses of IV antihypertensives in the MAU. She remained asymptomatic throughout her admission and was started on Procardia 30XL at bedtime. Her blood pressures stabilized and on day of discharge, she remained asymptomatic with Bps normal to mild range. She was discharged home in stable condition with plans for close interval outpatient BP check.  Consults: None  Significant Diagnostic Studies: labs:  Recent Results (from the past 2160 hour(s))  OB RESULT CONSOLE Group B Strep     Status: None   Collection Time: 03/08/23 12:00 AM  Result Value Ref Range   GBS Negative   CBC     Status: Abnormal   Collection Time: 03/30/23  1:56 PM  Result Value Ref Range   WBC 11.3 (H) 4.0 - 10.5 K/uL   RBC 4.19 3.87 - 5.11 MIL/uL   Hemoglobin 11.5 (L) 12.0 - 15.0 g/dL   HCT 40.9 (L) 81.1 - 91.4 %   MCV 83.1 80.0 - 100.0 fL   MCH 27.4 26.0 - 34.0 pg   MCHC 33.0 30.0 - 36.0 g/dL   RDW 78.2 95.6 - 21.3 %   Platelets 341 150 - 400 K/uL   nRBC 0.0 0.0 - 0.2 %    Comment: Performed at [facility], [address] [place], [place] [address]  Type and screen [facility]     Status: None   Collection Time: 03/30/23  2:00 PM  Result Value Ref Range   ABO/RH(D) A POS    Antibody Screen NEG    Sample Expiration      04/02/2023,2359 Performed at [facility], [address], [place], [place] [address]   Comprehensive metabolic panel     Status: Abnormal   Collection Time: 03/30/23  4:29 PM  Result Value Ref Range   Sodium  136 135 - 145 mmol/L   Potassium 4.4 3.5 - 5.1 mmol/L   Chloride 104 98 - 111 mmol/L   CO2 21 (L) 22 - 32 mmol/L   Glucose, Bld 104 (H) 70 - 99 mg/dL    Comment: Glucose reference range applies only to samples taken after fasting for at least 8 hours.   BUN 9 6 - 20 mg/dL   Creatinine, Ser 2.95 0.44 - 1.00 mg/dL   Calcium 8.8 (L) 8.9 - 10.3 mg/dL   Total Protein 5.7 (L) 6.5 - 8.1 g/dL   Albumin 2.4 (L) 3.5 - 5.0 g/dL   AST 13 (L) 15 - 41 U/L   ALT 13 0 - 44 U/L   Alkaline Phosphatase 105 38 - 126 U/L   Total Bilirubin 0.3 0.3 - 1.2 mg/dL   GFR, Estimated >28 >41 mL/min    Comment: (NOTE) Calculated using the CKD-EPI Creatinine Equation (2021)    Anion gap 11 5 - 15    Comment: Performed at [facility], [address], [place], [place] [address]  HIV Antibody (routine testing w rflx)     Status: None   Collection Time: 03/30/23  4:29 PM  Result Value Ref Range   HIV Screen 4th Generation wRfx Non Reactive Non Reactive    Comment:  Performed at [facility], [address] [place], [place] [address]  RPR     Status: None   Collection Time: 03/30/23  4:29 PM  Result Value Ref Range   RPR Ser Ql NON REACTIVE NON REACTIVE    Comment: Performed at [facility], [address], [place], [place] [address]  CBC with Differential/Platelet     Status: Abnormal   Collection Time: 03/31/23  6:13 AM  Result Value Ref Range   WBC 19.9 (H) 4.0 - 10.5 K/uL   RBC 4.27 3.87 - 5.11 MIL/uL   Hemoglobin 11.7 (L) 12.0 - 15.0 g/dL   HCT 09.8 (L) 11.9 - 14.7 %   MCV 82.0 80.0 - 100.0 fL   MCH 27.4 26.0 - 34.0 pg   MCHC 33.4 30.0 - 36.0 g/dL   RDW 82.9 56.2 - 13.0 %   Platelets 318 150 - 400 K/uL   nRBC 0.0 0.0 - 0.2 %   Neutrophils Relative % 91 %   Neutro Abs 18.1 (H) 1.7 - 7.7 K/uL   Lymphocytes Relative 3 %   Lymphs Abs 0.6 (L) 0.7 - 4.0 K/uL   Monocytes Relative 5 %   Monocytes Absolute 1.1 (H) 0.1 - 1.0 K/uL   Eosinophils Relative 0 %   Eosinophils Absolute 0.0 0.0 -  0.5 K/uL   Basophils Relative 0 %   Basophils Absolute 0.1 0.0 - 0.1 K/uL   Immature Granulocytes 1 %   Abs Immature Granulocytes 0.11 (H) 0.00 - 0.07 K/uL    Comment: Performed at [facility], [address] [place], [place] [address]  CBC     Status: Abnormal   Collection Time: 04/01/23  4:58 AM  Result Value Ref Range   WBC 13.2 (H) 4.0 - 10.5 K/uL   RBC 3.80 (L) 3.87 - 5.11 MIL/uL   Hemoglobin 10.6 (L) 12.0 - 15.0 g/dL   HCT 46.9 (L) 62.9 - 52.8 %   MCV 85.3 80.0 - 100.0 fL   MCH 27.9 26.0 - 34.0 pg   MCHC 32.7 30.0 - 36.0 g/dL   RDW 41.3 (H) 24.4 - 01.0 %   Platelets 300 150 - 400 K/uL   nRBC 0.0 0.0 - 0.2 %    Comment: Performed at [facility], [address] [place], [place] [address]  Comprehensive metabolic panel     Status: Abnormal   Collection Time: 04/01/23  4:58 AM  Result Value Ref Range   Sodium 135 135 - 145 mmol/L   Potassium 4.3 3.5 - 5.1 mmol/L   Chloride 109 98 - 111 mmol/L   CO2 19 (L) 22 - 32 mmol/L   Glucose, Bld 81 70 - 99 mg/dL    Comment: Glucose reference range applies only to samples taken after fasting for at least 8 hours.   BUN 11 6 - 20 mg/dL   Creatinine, Ser 6.64 0.44 - 1.00 mg/dL   Calcium 8.5 (L) 8.9 - 10.3 mg/dL   Total Protein 5.3 (L) 6.5 - 8.1 g/dL   Albumin 2.2 (L) 3.5 - 5.0 g/dL   AST 19 15 - 41 U/L   ALT 14 0 - 44 U/L   Alkaline Phosphatase 96 38 - 126 U/L   Total Bilirubin 0.5 0.3 - 1.2 mg/dL   GFR, Estimated >40 >34 mL/min    Comment: (NOTE) Calculated using the CKD-EPI Creatinine Equation (2021)    Anion gap 7 5 - 15    Comment: Performed at  [facility], [address], [place], [place] [address]  Collect bld for placenta donatation     Status: None   Collection Time: 04/01/23  4:58 AM  Result Value Ref Range   Placenta donation bld collect Collected by Laboratory     Comment: Performed at [facility], [address], [place], [place] [address]  Comprehensive metabolic panel     Status:  Abnormal   Collection Time: 04/05/23 12:39 PM  Result Value Ref Range   Sodium 137 135 - 145 mmol/L   Potassium 4.4 3.5 - 5.1 mmol/L   Chloride 104 98 - 111 mmol/L   CO2 26 22 - 32 mmol/L   Glucose, Bld 96 70 - 99 mg/dL    Comment: Glucose reference range applies only to samples taken after fasting for at least 8 hours.   BUN 12 6 - 20 mg/dL   Creatinine, Ser 0.98 0.44 - 1.00 mg/dL   Calcium 9.2 8.9 - 11.9 mg/dL   Total Protein 6.2 (L) 6.5 - 8.1 g/dL   Albumin 2.8 (L) 3.5 - 5.0 g/dL   AST 16 15 - 41 U/L   ALT 20 0 - 44 U/L   Alkaline Phosphatase 83 38 - 126 U/L   Total Bilirubin 0.2 (L) 0.3 - 1.2 mg/dL   GFR, Estimated >14 >78 mL/min    Comment: (NOTE) Calculated using the CKD-EPI Creatinine Equation (2021)    Anion gap 7 5 - 15    Comment: Performed at [facility], [address], [place], [place] [address]  CBC     Status: Abnormal   Collection Time: 04/05/23 12:39 PM  Result Value Ref Range   WBC 7.8 4.0 - 10.5 K/uL   RBC 4.25 3.87 - 5.11 MIL/uL   Hemoglobin 11.2 (L) 12.0 - 15.0 g/dL   HCT 13.0 (L) 86.5 - 78.4 %   MCV 84.0 80.0 - 100.0 fL   MCH 26.4 26.0 - 34.0 pg   MCHC 31.4 30.0 - 36.0 g/dL   RDW 69.6 29.5 - 28.4 %   Platelets 370 150 - 400 K/uL   nRBC 0.0 0.0 - 0.2 %    Comment: Performed at [facility], [address], [place], [place] [address]     Treatments: IV magnesium, IV and oral antihypertensives  Discharge Exam: Blood pressure 125/71, pulse 66, temperature 98 F (36.7 C), temperature source Oral, resp. rate 19, height 5\' 3"  (1.6 m), weight 114.4 kg, SpO2 99 %, currently breastfeeding. General appearance: alert, cooperative, and no distress Resp: no increased work of breathing Cardio: regular rate GI: soft, nondistended, nontender. Fundus appropriate, below umbilicus Extremities: trace peripheral edema  Disposition: Discharge disposition: 01-Home or Self Care       Discharge Instructions     Activity as tolerated   Complete by:  As directed    No heavy lifting , no driving for 2 weeks, and no vaginal entry for 6 weeks.   Call MD for:  difficulty breathing, headache or visual disturbances   Complete by: As directed    Call MD for:  hives   Complete by: As directed    Call MD for:  persistant dizziness or light-headedness   Complete by: As directed    Call MD for:  persistant nausea and vomiting   Complete by: As directed    Call MD for:  redness, tenderness, or signs of infection (pain, swelling, redness, odor or green/yellow discharge around incision site)  Complete by: As directed    Call MD for:  severe uncontrolled pain   Complete by: As directed    Call MD for:  temperature >100.4   Complete by: As directed    Diet general   Complete by: As directed    Discharge instructions   Complete by: As directed    Post partum brochure   Driving restriction    Complete by: As directed    Avoid driving for at least 2 weeks.   Lifting restrictions   Complete by: As directed    Weight restriction of 10 lbs.      Allergies as of 04/06/2023       Reactions   Mango Flavor [flavoring Agent] Anaphylaxis, Itching, Swelling   Latex Itching   "Facial itching"        Medication List     TAKE these medications    acetaminophen 325 MG tablet Commonly known as: Tylenol Take 2 tablets (650 mg total) by mouth every 4 (four) hours as needed (for pain scale < 4).   ibuprofen 600 MG tablet Commonly known as: ADVIL Take 1 tablet (600 mg total) by mouth every 6 (six) hours.   NIFEdipine 30 MG 24 hr tablet Commonly known as: ADALAT CC Take 1 tablet (30 mg total) by mouth at bedtime.         Signed: Tawni Levy 04/06/2023, 6:03 PM

## 2023-04-06 NOTE — Progress Notes (Signed)
   04/06/23 1816  Departure Condition  Departure Condition Good  Mobility at [facility]  Patient/Caregiver Teaching Teach Back Method Used;Discharge instructions reviewed;Prescriptions reviewed;Follow-up care reviewed;Pain management discussed;Medications discussed;Patient/caregiver verbalized understanding;Educated about hypertension in pregnancy  Departure Mode With family  Was procedural sedation performed on this patient during this visit? No   Patient alert and oriented x4, VS and pain stable at discharge.

## 2023-04-07 DIAGNOSIS — O169 Unspecified maternal hypertension, unspecified trimester: Secondary | ICD-10-CM | POA: Diagnosis not present

## 2023-04-13 ENCOUNTER — Other Ambulatory Visit (HOSPITAL_COMMUNITY): Payer: Self-pay

## 2023-04-28 ENCOUNTER — Telehealth (HOSPITAL_COMMUNITY): Payer: Self-pay | Admitting: *Deleted

## 2023-04-28 NOTE — Telephone Encounter (Signed)
04/28/2023  Name: Willoe Kerwick MRN: [id number] DOB: 09/03/1991  Reason for Call:  Transition of Care Hospital Discharge Call  Contact Status: Patient Contact Status: Message  Language assistant needed: Interpreter Mode: Interpreter Not Needed        Follow-Up Questions:    Inocente Salles Postnatal Depression Scale:  In the Past 7 Days:    PHQ2-9 Depression Scale:     Discharge Follow-up:    Post-discharge interventions: NA  Salena Saner, RN 04/28/2023 16:07

## 2023-05-04 ENCOUNTER — Other Ambulatory Visit (HOSPITAL_COMMUNITY): Payer: Self-pay

## 2023-05-04 DIAGNOSIS — N760 Acute vaginitis: Secondary | ICD-10-CM | POA: Diagnosis not present

## 2023-05-04 DIAGNOSIS — R300 Dysuria: Secondary | ICD-10-CM | POA: Diagnosis not present

## 2023-05-04 DIAGNOSIS — R319 Hematuria, unspecified: Secondary | ICD-10-CM | POA: Diagnosis not present

## 2023-05-04 DIAGNOSIS — Z8759 Personal history of other complications of pregnancy, childbirth and the puerperium: Secondary | ICD-10-CM | POA: Diagnosis not present

## 2023-05-04 MED ORDER — SULFAMETHOXAZOLE-TRIMETHOPRIM 800-160 MG PO TABS
1.0000 | ORAL_TABLET | Freq: Two times a day (BID) | ORAL | 0 refills | Status: DC
  Filled 2023-05-04: qty 14, 7d supply, fill #0

## 2023-05-18 DIAGNOSIS — Z8759 Personal history of other complications of pregnancy, childbirth and the puerperium: Secondary | ICD-10-CM | POA: Diagnosis not present

## 2023-05-18 DIAGNOSIS — Z1389 Encounter for screening for other disorder: Secondary | ICD-10-CM | POA: Diagnosis not present

## 2023-05-18 DIAGNOSIS — Z309 Encounter for contraceptive management, unspecified: Secondary | ICD-10-CM | POA: Diagnosis not present

## 2023-06-15 DIAGNOSIS — Z3202 Encounter for pregnancy test, result negative: Secondary | ICD-10-CM | POA: Diagnosis not present

## 2023-06-15 DIAGNOSIS — Z30430 Encounter for insertion of intrauterine contraceptive device: Secondary | ICD-10-CM | POA: Diagnosis not present

## 2023-07-25 DIAGNOSIS — Z30431 Encounter for routine checking of intrauterine contraceptive device: Secondary | ICD-10-CM | POA: Diagnosis not present

## 2023-07-25 DIAGNOSIS — Z7689 Persons encountering health services in other specified circumstances: Secondary | ICD-10-CM | POA: Diagnosis not present

## 2023-07-25 DIAGNOSIS — Z3009 Encounter for other general counseling and advice on contraception: Secondary | ICD-10-CM | POA: Diagnosis not present

## 2023-07-25 DIAGNOSIS — N921 Excessive and frequent menstruation with irregular cycle: Secondary | ICD-10-CM | POA: Diagnosis not present

## 2023-07-25 DIAGNOSIS — Z3040 Encounter for surveillance of contraceptives, unspecified: Secondary | ICD-10-CM | POA: Diagnosis not present

## 2023-11-17 IMAGING — CT CT ABD-PELV W/ CM
2 of 4 series · 16 of 46 positions shown, 18 images · IV contrast (Omni 300)
Comparison: 11/04/2021.

CLINICAL DATA: Left lower quadrant abdominal pain.

EXAM:
CT ABDOMEN AND PELVIS WITH CONTRAST
TECHNIQUE: Multidetector CT imaging of the abdomen and pelvis was performed
using the standard protocol following bolus administration of
intravenous contrast.

[Series 3: a/p w/ 5mm · axial · 0.97mm/px · z∈[+733,+1173]mm · 13 of 98 slices shown, 15 images]
[im 5/98  soft-tissue]
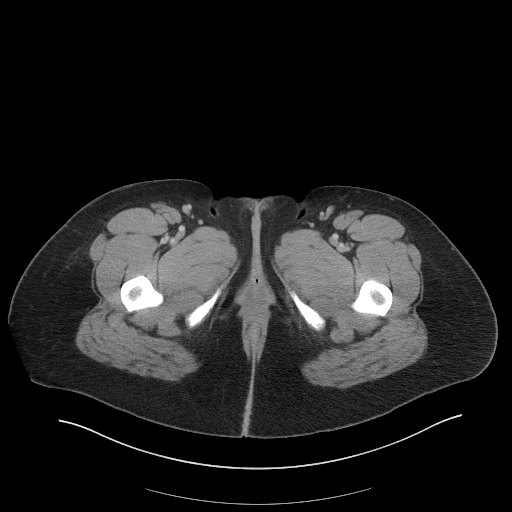
[im 5/98  bone]
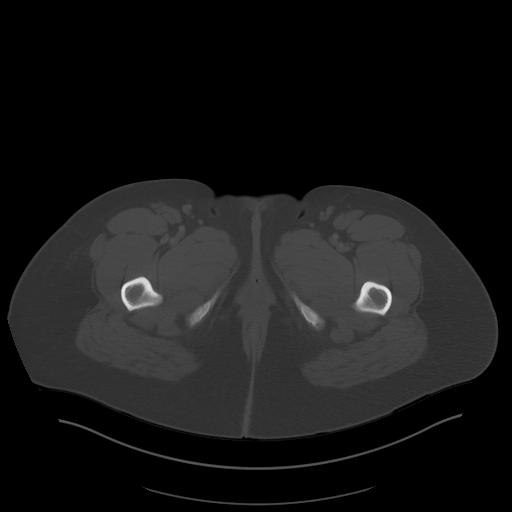
[im 13/98  soft-tissue]
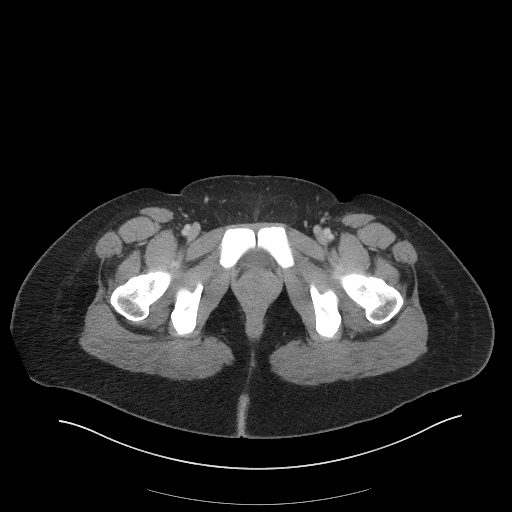
[im 22/98  soft-tissue]
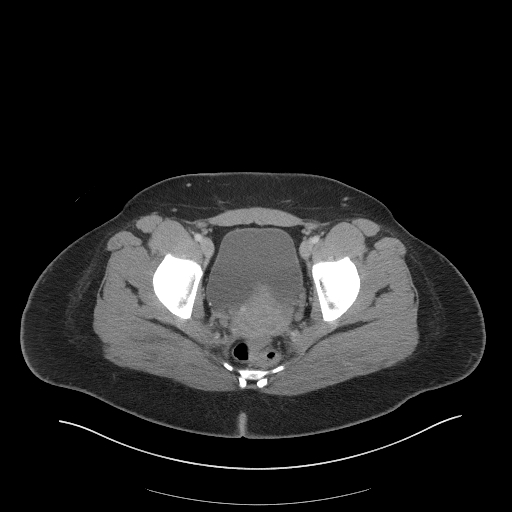
[im 26/98  soft-tissue]
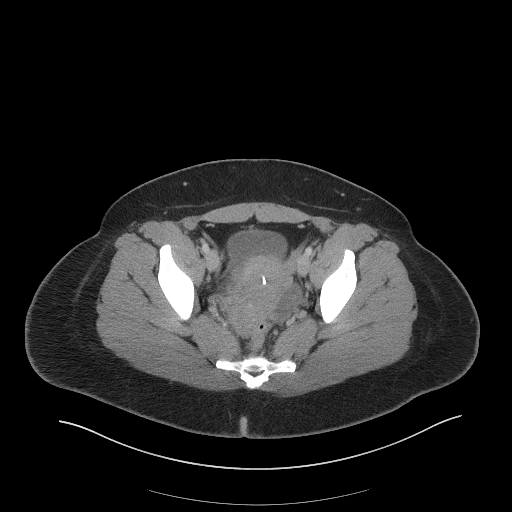
[im 34/98  soft-tissue]
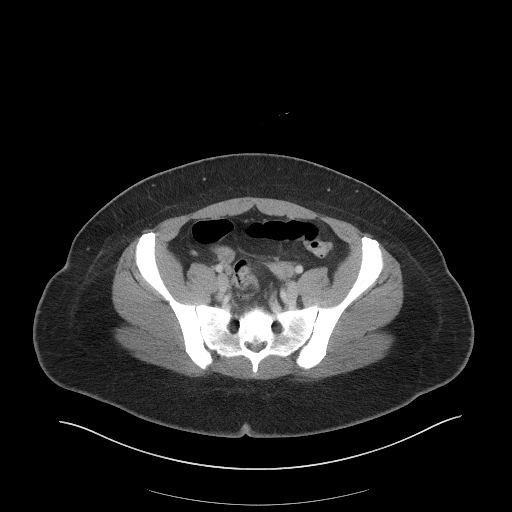
[im 43/98  soft-tissue]
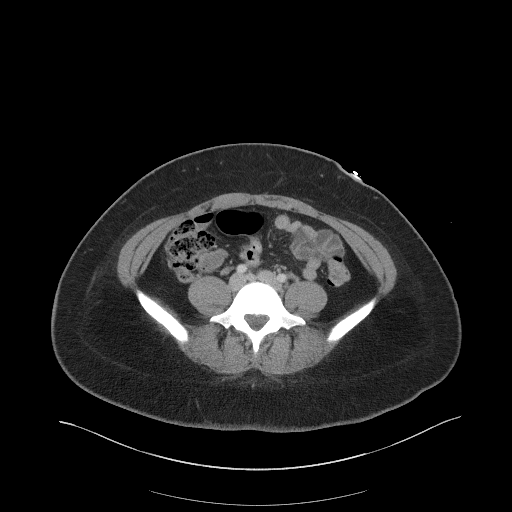
[im 51/98  soft-tissue]
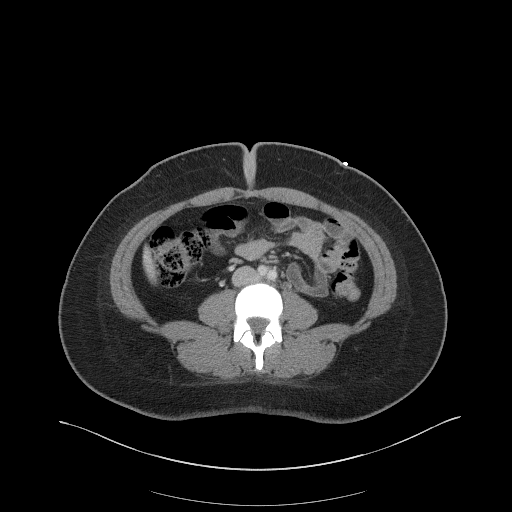
[im 55/98  soft-tissue]
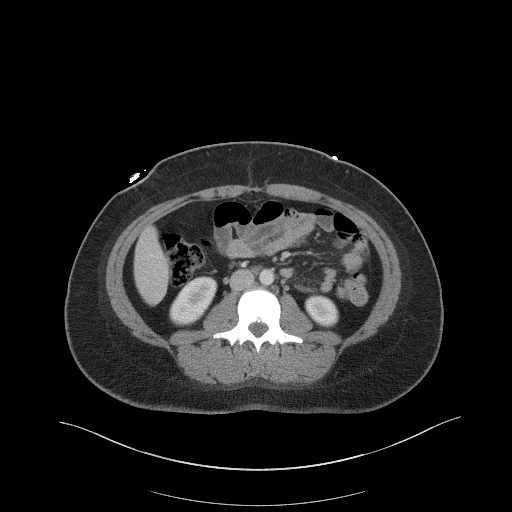
[im 64/98  soft-tissue]
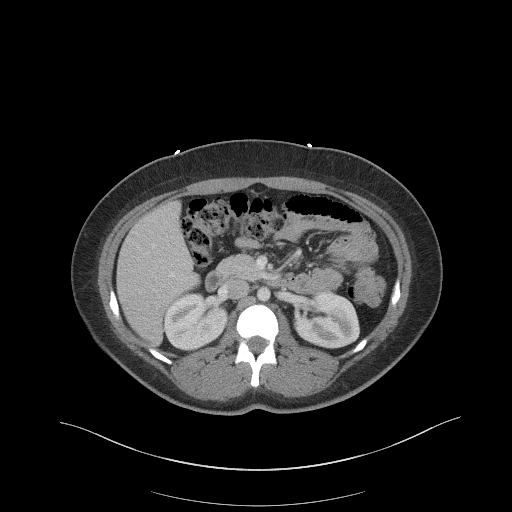
[im 64/98  bone]
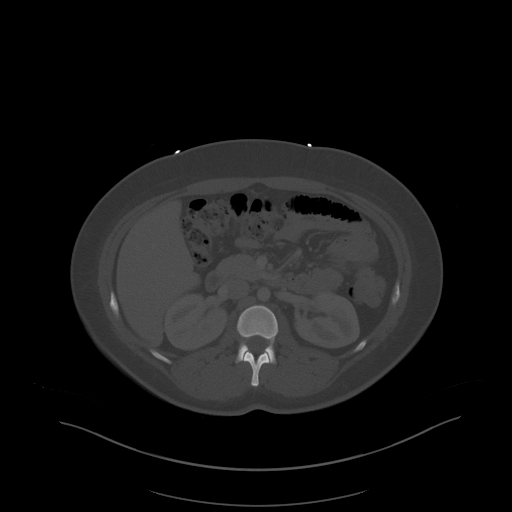
[im 72/98  soft-tissue]
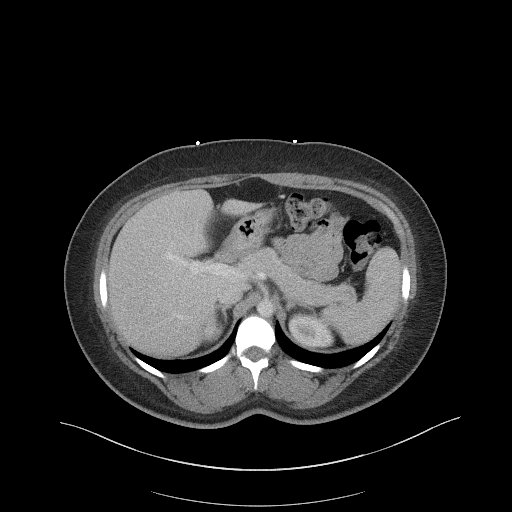
[im 76/98  soft-tissue]
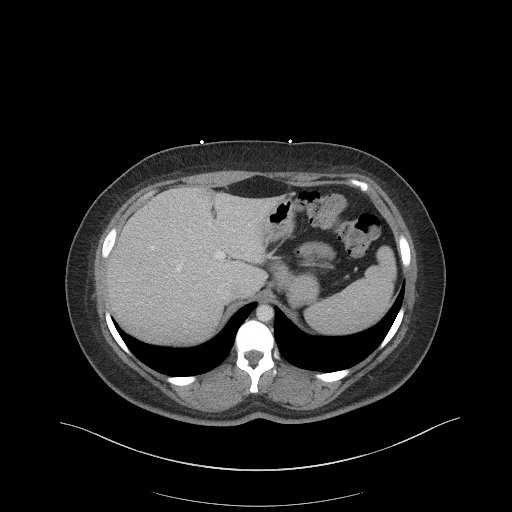
[im 85/98  soft-tissue]
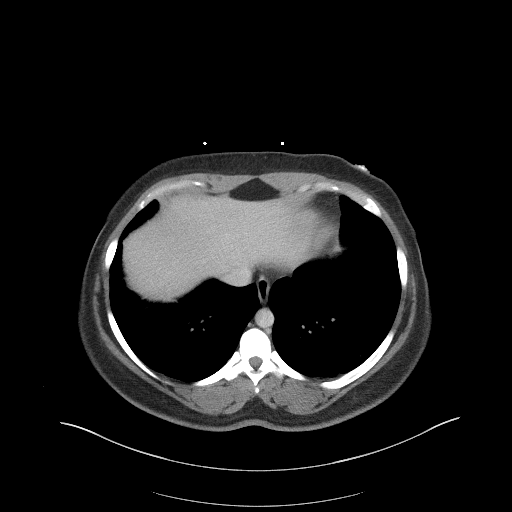
[im 93/98  soft-tissue]
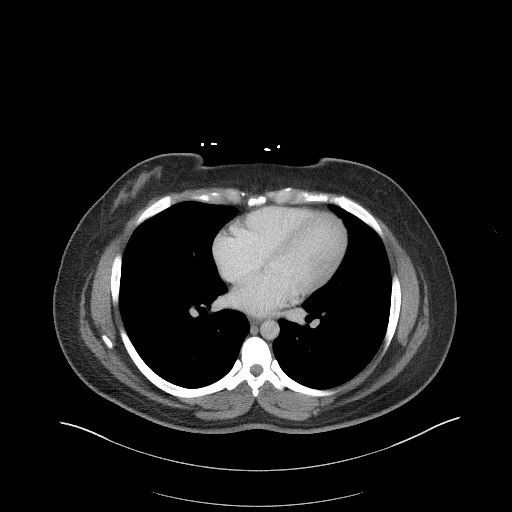

[Series 6: a/p w/ cor · coronal · 0.90mm/px · 3 of 160 slices shown]
[im 54/160  soft-tissue]
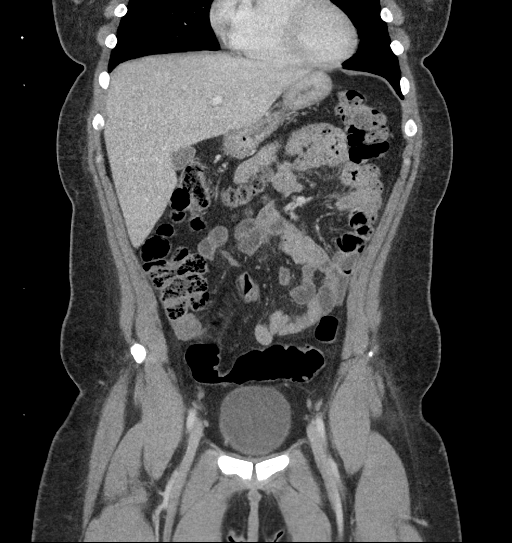
[im 71/160  soft-tissue]
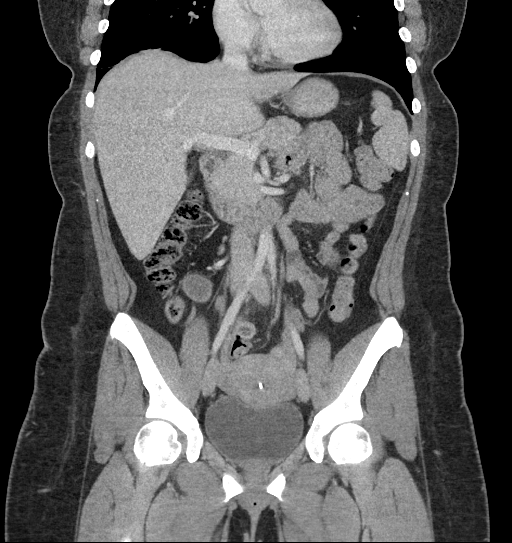
[im 89/160  soft-tissue]
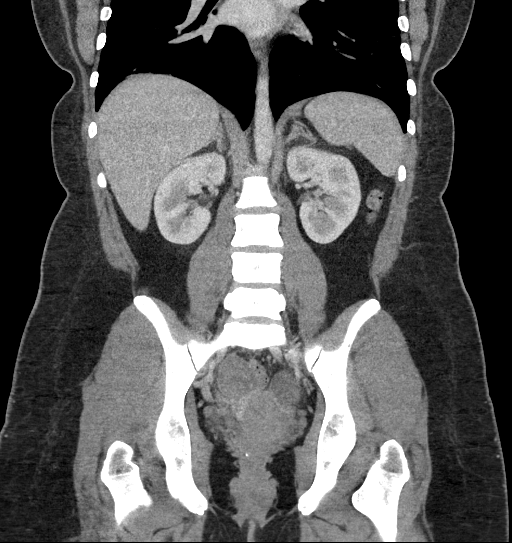

[16 of 46 positions shown; findings below may reference images not displayed]

RADIATION DOSE REDUCTION: This exam was performed according to the
departmental dose-optimization program which includes automated
exposure control, adjustment of the mA and/or kV according to
patient size and/or use of iterative reconstruction technique.

CONTRAST:  100mL OMNIPAQUE IOHEXOL 300 MG/ML  SOLN
FINDINGS: Lower chest: Atelectasis is present at the lung bases.

Hepatobiliary: There is focal fatty infiltration of the liver
adjacent to the falciform ligament. No gallstones, gallbladder wall
thickening, or biliary dilatation.

Pancreas: Unremarkable. No pancreatic ductal dilatation or
surrounding inflammatory changes.

Spleen: Normal in size without focal abnormality.

Adrenals/Urinary Tract: Adrenal glands are unremarkable. Kidneys are
normal, without renal calculi, focal lesion, or hydronephrosis.
Bladder is unremarkable.

Stomach/Bowel: Stomach is within normal limits. Appendix appears
normal. No evidence of bowel wall thickening, distention, or
inflammatory changes. No free air or pneumatosis.

Vascular/Lymphatic: No significant vascular findings are present. No
enlarged abdominal or pelvic lymph nodes.

Reproductive: An IUD is present in the uterus. There are cystic
structure is present in the adnexal regions bilaterally measuring
3.2 cm on the right and 2.2 cm on the left.

Other: No abdominal wall hernia or abnormality. No abdominopelvic
ascites.

Musculoskeletal: No acute or significant osseous findings.
IMPRESSION: 1. No acute intra-abdominal process.
2. Cystic structures in the adnexal regions bilaterally, better
evaluated on recent ultrasound.

## 2023-11-17 IMAGING — US US PELVIS COMPLETE TRANSABD/TRANSVAG W DUPLEX AND/OR DOPPLER
1 series · 13 of 25 positions shown · non-contrast
Comparison: 11/24/2020.

CLINICAL DATA: Pelvic pain. The patient has had an IUD for the past
5 years.

EXAM:
TRANSABDOMINAL AND TRANSVAGINAL ULTRASOUND OF PELVIS
DOPPLER ULTRASOUND OF OVARIES
TECHNIQUE: Both transabdominal and transvaginal ultrasound examinations of the
pelvis were performed. Transabdominal technique was performed for
global imaging of the pelvis including uterus, ovaries, adnexal
regions, and pelvic cul-de-sac.
It was necessary to proceed with endovaginal exam following the
transabdominal exam to visualize the uterus and ovaries in better
detail. Color and duplex Doppler ultrasound was utilized to
evaluate blood flow to the ovaries.

[Series 1: us pelvic complete w transvaginal and torsion righ · 13 of 82 slices shown]
[im 1/82]
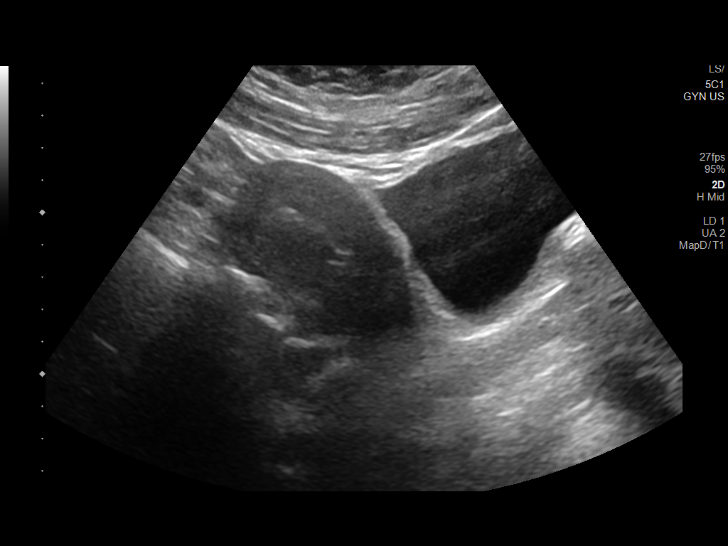
[im 7/82]
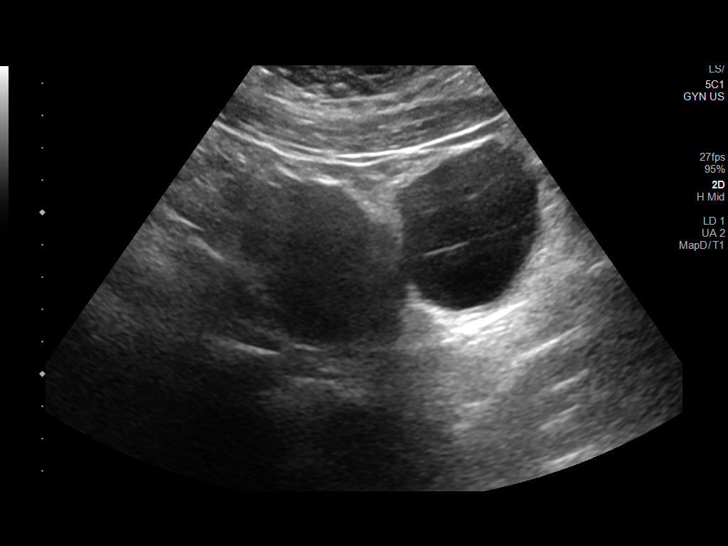
[im 14/82]
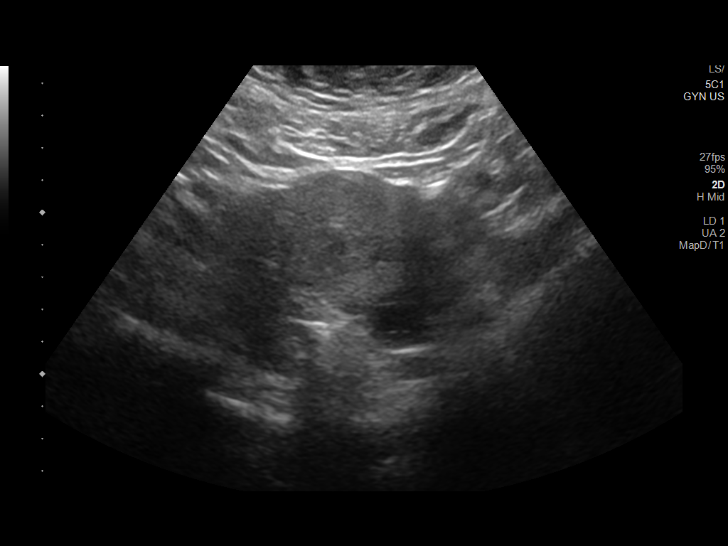
[im 21/82]
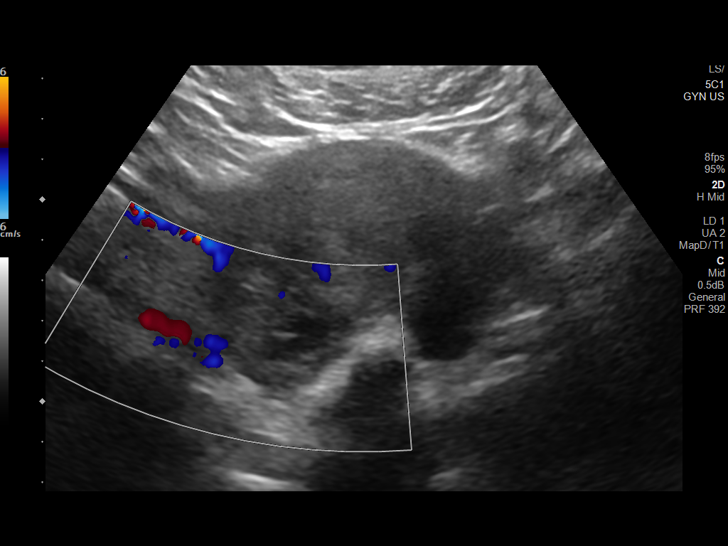
[im 28/82]
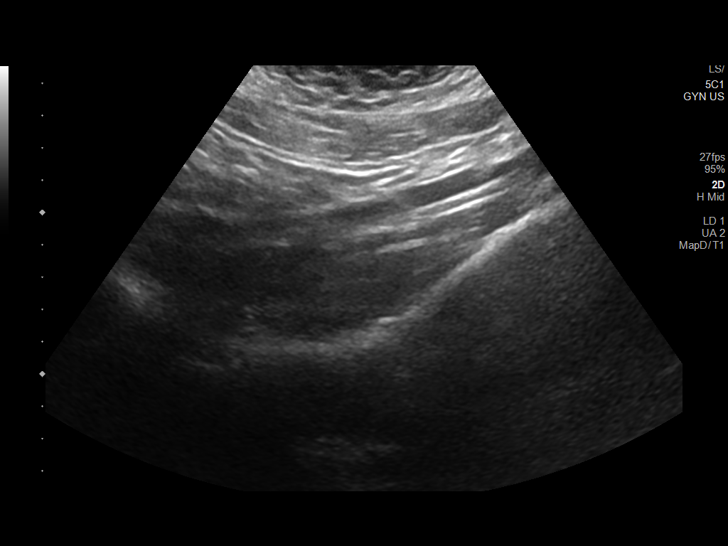
[im 34/82]
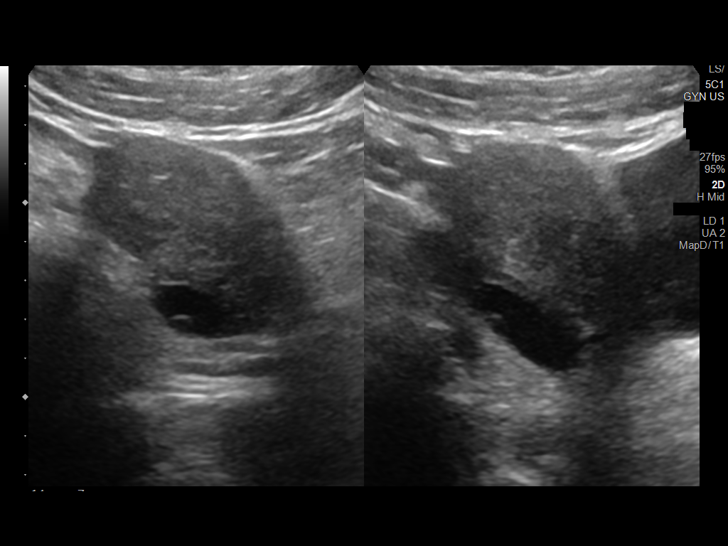
[im 41/82]
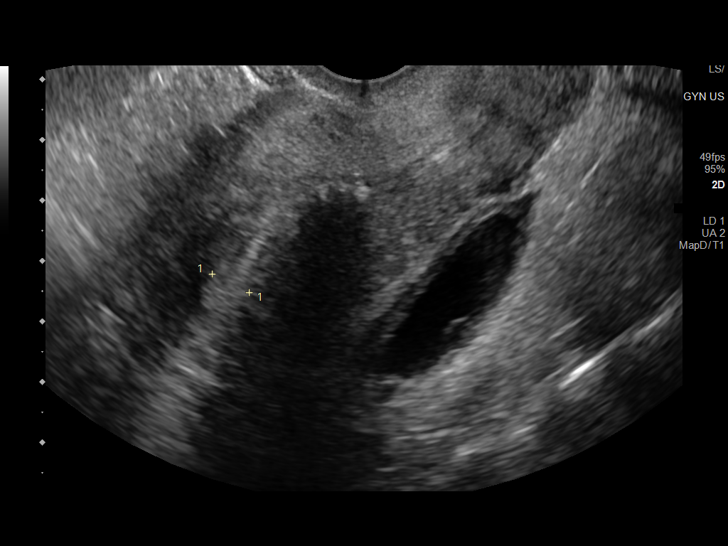
[im 48/82]
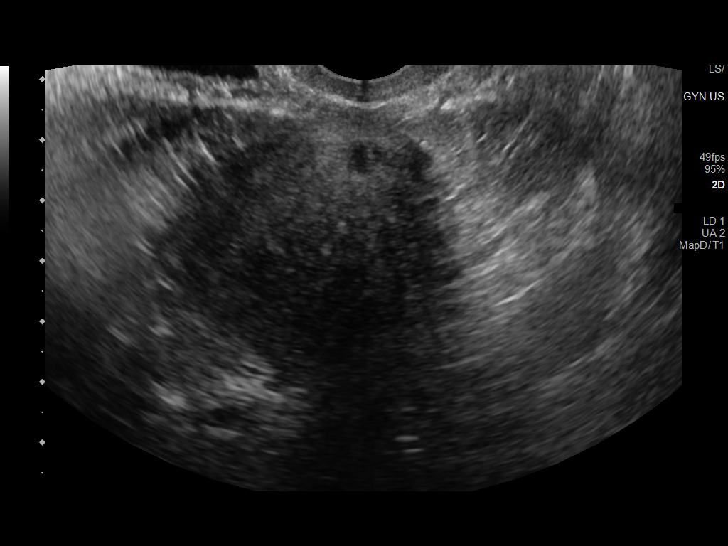
[im 55/82]
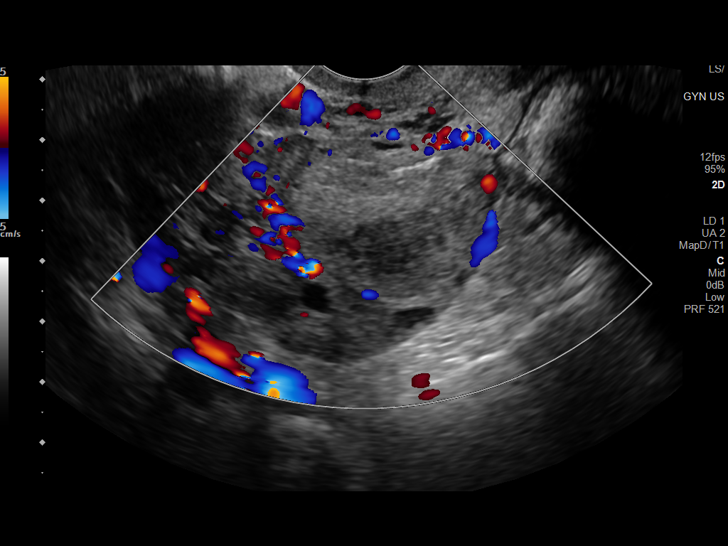
[im 61/82]
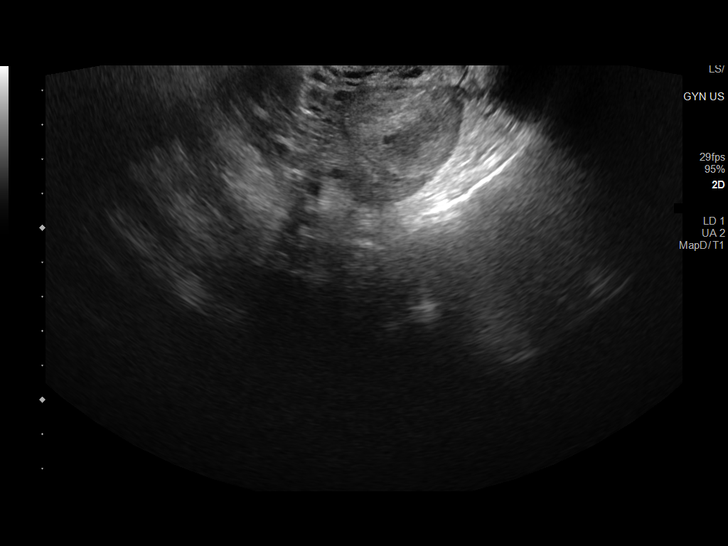
[im 68/82]
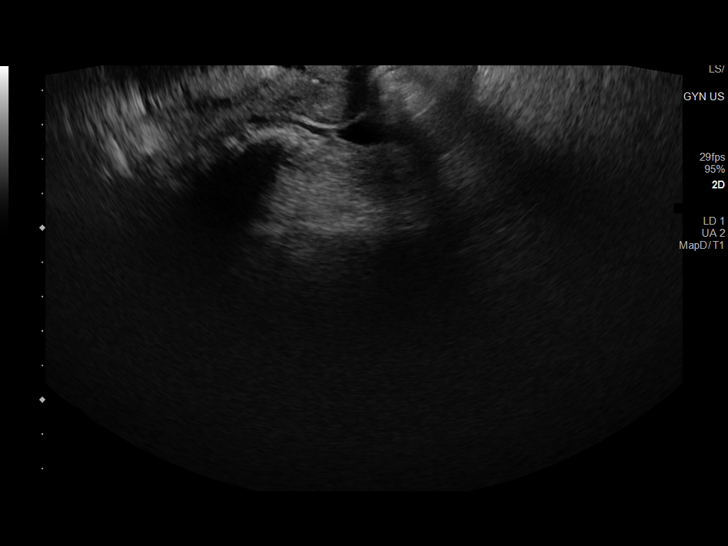
[im 75/82]
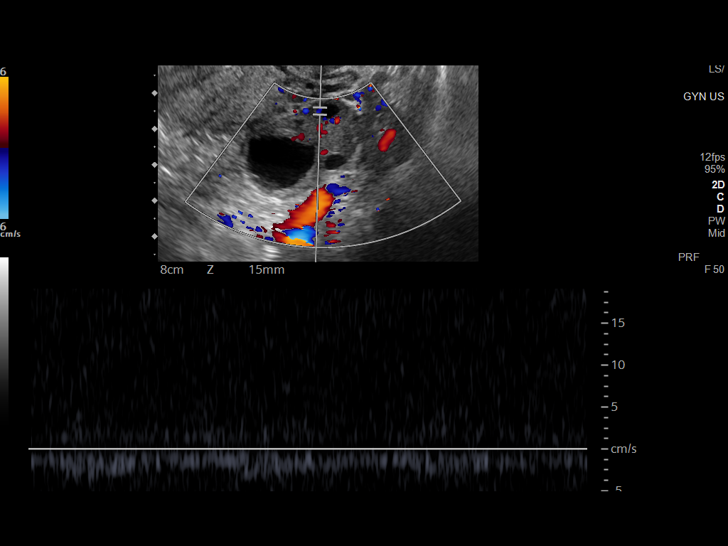
[im 82/82]
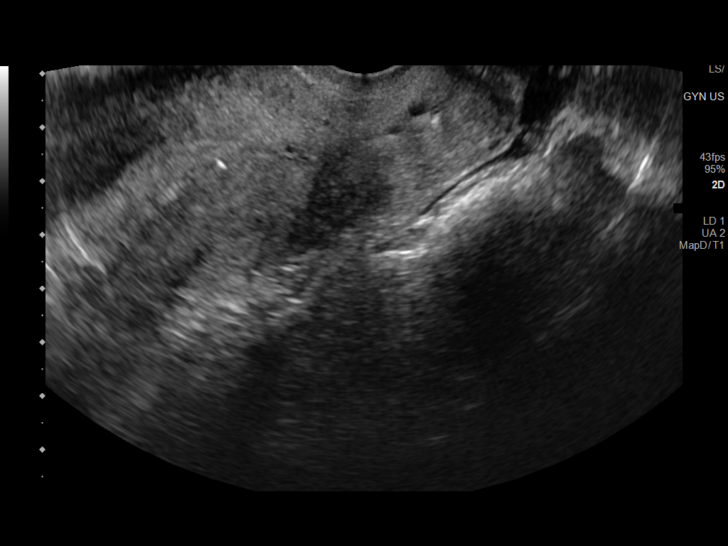

[13 of 25 positions shown; findings below may reference images not displayed]

FINDINGS: Uterus

Measurements: 9.7 x 5.1 x 4.2 cm = volume: 108 mL. No fibroids or
other mass visualized.

Endometrium

Thickness: 6.8 mm. Stable irregular echogenic focus with dense
posterior acoustical shadowing in the endometrial canal in the lower
uterine segment

Right ovary

Measurements: 4.7 x 3.7 x 3.7 cm = volume: 34 mL. Interval 3.5 x
x 2.7 cm rounded, circumscribed mass with echogenic and hypoechoic
components in the right ovary. Prominent surrounding blood flow
without internal blood flow with color Doppler.

Left ovary

Measurements: 3.9 x 3.4 x 1.9 cm = volume: 13 mL. Normal
appearance/no adnexal mass.

Pulsed Doppler evaluation of both ovaries demonstrates normal
low-resistance arterial and venous waveforms.

Other findings

No abnormal free fluid.
IMPRESSION: 1. 3.5 cm right ovarian probable corpus luteum. A hemorrhagic cyst
or ovarian endometrioma are less likely possibilities. A follow-up
pelvic ultrasound is recommended in 3 months to ensure resolution.
2. Stable position of the intrauterine device in the endometrial
canal in the lower uterine segment.
